# Patient Record
Sex: Female | Born: 2000 | Race: White | Hispanic: Yes | Smoking: Never smoker
Health system: Southern US, Community
[De-identification: ages and names within clinical notes are randomized; demographics above are authoritative.]

## PROBLEM LIST (undated history)

## (undated) DIAGNOSIS — Z789 Other specified health status: Secondary | ICD-10-CM

## (undated) HISTORY — DX: Other specified health status: Z78.9

## (undated) HISTORY — PX: NO PAST SURGERIES: SHX2092

## (undated) HISTORY — PX: OTHER SURGICAL HISTORY: SHX169

---
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
## 2021-09-08 ENCOUNTER — Ambulatory Visit (LOCAL_COMMUNITY_HEALTH_CENTER): Payer: Self-pay

## 2021-09-08 VITALS — BP 107/79 | Ht 62.0 in | Wt 146.5 lb

## 2021-09-08 DIAGNOSIS — Z3201 Encounter for pregnancy test, result positive: Secondary | ICD-10-CM

## 2021-09-08 MED ORDER — PRENATAL 27-0.8 MG PO TABS: 1.0000 | ORAL_TABLET | Freq: Every day | ORAL | 0 refills | Status: AC

## 2021-09-08 NOTE — Progress Notes (Signed)
UPT positive. Plans prenatal care at [facility]. Sent to clerk for preadmit. Jerel Shepherd, RN

## 2021-09-09 LAB — PREGNANCY, URINE: Preg Test, Ur: POSITIVE — AB

## 2021-09-20 ENCOUNTER — Ambulatory Visit: Payer: Medicaid Other | Admitting: Advanced Practice Midwife

## 2021-09-20 ENCOUNTER — Encounter: Payer: Self-pay | Admitting: Advanced Practice Midwife

## 2021-09-20 VITALS — BP 91/52 | HR 88 | Temp 98.6°F | Wt 145.8 lb

## 2021-09-20 DIAGNOSIS — O0930 Supervision of pregnancy with insufficient antenatal care, unspecified trimester: Secondary | ICD-10-CM | POA: Insufficient documentation

## 2021-09-20 DIAGNOSIS — Z3482 Encounter for supervision of other normal pregnancy, second trimester: Secondary | ICD-10-CM

## 2021-09-20 DIAGNOSIS — O0932 Supervision of pregnancy with insufficient antenatal care, second trimester: Secondary | ICD-10-CM | POA: Diagnosis not present

## 2021-09-20 LAB — HEMOGLOBIN, FINGERSTICK: Hemoglobin: 14.1 g/dL (ref 11.1–15.9)

## 2021-09-20 LAB — WET PREP FOR TRICH, YEAST, CLUE
Trichomonas Exam: NEGATIVE
Yeast Exam: NEGATIVE

## 2021-09-20 NOTE — Progress Notes (Signed)
[facility]  Maternal Health Clinic   INITIAL PRENATAL VISIT NOTE  Subjective:  Donna Wolfe is a 21 y.o. DHF nonsmoker G2P1001 (2 yo son) at [redacted]w[redacted]d being seen today to start prenatal care at the [facility]. She feels "very well" about surprise pregnancy with no birth control. 21 yo employed FOB  feels "happy" about pregnancy and has no children; in supportive 6 mo relationship. She is working 40+ hrs/wk and living with FOB, her son, pt's 61 yo sister. Her son's father kicked her out and she was living with her mom until she found this partners. LMP 05/25/21. Denies any u/s or ER visits this pregnancy. Has never had a dental exam. Denies cigs, vaping, cigars, MJ, ETOH. Wants NIPS. Has never had a pap smear.  She is currently monitored for the following issues for this low-risk pregnancy and has Illiterate--needs assistance with reading and writing; Late prenatal care 16 6/7; and Prenatal care, subsequent pregnancy, second trimester on their problem list.  Patient reports no complaints.  Contractions: Not present. Vag. Bleeding: None.  Movement: Absent. Denies leaking of fluid.   Indications for ASA therapy (per uptodate) One of the following: Previous pregnancy with preeclampsia, especially early onset and with an adverse outcome No Multifetal gestation No Chronic hypertension No Type 1 or 2 diabetes mellitus No Chronic kidney disease No Autoimmune disease (antiphospholipid syndrome, systemic lupus erythematosus) No  Two or more of the following: Nulliparity No Obesity (body mass index >30 kg/m2) No Family history of preeclampsia in mother or sister No Age ?35 years No Sociodemographic characteristics (African American race, low socioeconomic level) No Personal risk factors (eg, previous pregnancy with low birth weight or small for gestational age infant, previous adverse pregnancy outcome [eg, stillbirth], interval >10 years between  pregnancies) No   The following portions of the patient's history were reviewed and updated as appropriate: allergies, current medications, past family history, past medical history, past social history, past surgical history and problem list. Problem list updated.  Objective:   Vitals:   09/20/21 0853  BP: (!) 91/52  Pulse: 88  Temp: 98.6 F (37 C)  Weight: 145 lb 12.8 oz (66.1 kg)    Fetal Status: Fetal Heart Rate (bpm): 150 Fundal Height: 18 cm Movement: Absent  Presentation: Undeterminable   Physical Exam Vitals and nursing note reviewed.  Constitutional:      General: She is not in acute distress.    Appearance: Normal appearance. She is well-developed.  HENT:     Head: Normocephalic and atraumatic.     Right Ear: External ear normal.     Left Ear: External ear normal.     Nose: Nose normal. No congestion or rhinorrhea.     Mouth/Throat:     Lips: Pink.     Mouth: Mucous membranes are moist.     Dentition: Normal dentition. No dental caries.     Pharynx: Oropharynx is clear. Uvula midline.     Comments: Dentition: fair; never had dental exam Eyes:     General: No scleral icterus.    Conjunctiva/sclera: Conjunctivae normal.  Neck:     Thyroid: No thyroid mass, thyromegaly or thyroid tenderness.  Cardiovascular:     Rate and Rhythm: Normal rate.     Pulses: Normal pulses.     Comments: Extremities are warm and well perfused Pulmonary:     Effort: Pulmonary effort is normal.     Breath sounds: Normal breath sounds.  Chest:  Chest wall: No mass.  Breasts:    Tanner Score is 5.     Breasts are symmetrical.     Right: Normal. No mass, nipple discharge or skin change.     Left: Normal. No mass, nipple discharge or skin change.  Abdominal:     Palpations: Abdomen is soft.     Tenderness: There is no abdominal tenderness.     Comments: Gravid, soft without masses or tenderness, FH=18 wks, FHR=150  Genitourinary:    General: Normal vulva.     Exam position:  Lithotomy position.     Pubic Area: No rash.      Labia:        Right: No rash.        Left: No rash.      Vagina: Vaginal discharge (white creamy leukorrhea, ph<4.5) present.     Cervix: Normal.     Uterus: Enlarged (Gravid 18 wks size). Not tender.      Rectum: Normal. No external hemorrhoid.  Musculoskeletal:     Right lower leg: No edema.     Left lower leg: No edema.  Lymphadenopathy:     Cervical: No cervical adenopathy.     Upper Body:     Right upper body: No axillary adenopathy.     Left upper body: No axillary adenopathy.  Skin:    General: Skin is warm.     Capillary Refill: Capillary refill takes less than 2 seconds.  Neurological:     Mental Status: She is alert.    Assessment and Plan:  Pregnancy: G2P1001 at [redacted]w[redacted]d  1. Late prenatal care 16 6/7   2. Prenatal care, subsequent pregnancy, second trimester Counseled on weight gain of 15-25 lbs Wants NIPS today Please give pt [facility] # Dating u/s ordered  - Prenatal Profile I - MaterniT21 PLUS Core - AFP, Serum, Open Spina Bifida - 149702 Drug Screen - WET PREP FOR TRICH, YEAST, CLUE - Hemoglobin, fingerstick    Discussed overview of care and coordination with inpatient delivery practices including [facility], [facility], [facility] and [facility].   Reviewed Centering pregnancy as standard of care at [facility]   Preterm labor symptoms and general obstetric precautions including but not limited to vaginal bleeding, contractions, leaking of fluid and fetal movement were reviewed in detail with the patient.  Please refer to After Visit Summary for other counseling recommendations.   Return in about 4 weeks (around 10/18/2021) for routine PNC.  No future appointments.  Alberteen Spindle, CNM

## 2021-09-20 NOTE — Progress Notes (Signed)
Patient here for IP at [redacted]w[redacted]d.   Wet prep and hgb reviewed during clinic visit - no treatment indicated.   [facility] [place] referral faxed with confirmation. 09/20/21.   Earlyne Iba, RN

## 2021-09-21 LAB — 789231 7+OXYCODONE-BUND
Amphetamines, Urine: NEGATIVE ng/mL
BENZODIAZ UR QL: NEGATIVE ng/mL
Barbiturate screen, urine: NEGATIVE ng/mL
Cannabinoid Quant, Ur: NEGATIVE ng/mL
Cocaine (Metab.): NEGATIVE ng/mL
OPIATE SCREEN URINE: NEGATIVE ng/mL
Oxycodone/Oxymorphone, Urine: NEGATIVE ng/mL
PCP Quant, Ur: NEGATIVE ng/mL

## 2021-09-22 LAB — PREGNANCY, INITIAL SCREEN
Antibody Screen: NEGATIVE
Basophils Absolute: 0 10*3/uL (ref 0.0–0.2)
Basos: 0 %
Bilirubin, UA: NEGATIVE
Chlamydia trachomatis, NAA: NEGATIVE
EOS (ABSOLUTE): 0.1 10*3/uL (ref 0.0–0.4)
Eos: 1 %
Glucose, UA: NEGATIVE
HCV Ab: NONREACTIVE
HIV Screen 4th Generation wRfx: NONREACTIVE
Hematocrit: 43.4 % (ref 34.0–46.6)
Hemoglobin: 14 g/dL (ref 11.1–15.9)
Hepatitis B Surface Ag: NEGATIVE
Immature Grans (Abs): 0 10*3/uL (ref 0.0–0.1)
Immature Granulocytes: 0 %
Leukocytes,UA: NEGATIVE
Lymphocytes Absolute: 2.2 10*3/uL (ref 0.7–3.1)
Lymphs: 23 %
MCH: 26.1 pg — ABNORMAL LOW (ref 26.6–33.0)
MCHC: 32.3 g/dL (ref 31.5–35.7)
MCV: 81 fL (ref 79–97)
Monocytes Absolute: 0.6 10*3/uL (ref 0.1–0.9)
Monocytes: 6 %
Neisseria Gonorrhoeae by PCR: NEGATIVE
Neutrophils Absolute: 6.9 10*3/uL (ref 1.4–7.0)
Neutrophils: 70 %
Nitrite, UA: NEGATIVE
Platelets: 346 10*3/uL (ref 150–450)
Protein,UA: NEGATIVE
RBC, UA: NEGATIVE
RBC: 5.36 x10E6/uL — ABNORMAL HIGH (ref 3.77–5.28)
RDW: 17 % — ABNORMAL HIGH (ref 11.7–15.4)
RPR Ser Ql: NONREACTIVE
Rh Factor: POSITIVE
Rubella Antibodies, IGG: 6.55 index (ref >0.99)
Specific Gravity, UA: 1.017 (ref 1.005–1.030)
Urobilinogen, Ur: 1 mg/dL (ref 0.2–1.0)
WBC: 9.8 10*3/uL (ref 3.4–10.8)
pH, UA: 7 (ref 5.0–7.5)

## 2021-09-22 LAB — MICROSCOPIC EXAMINATION
Casts: NONE SEEN /lpf
WBC, UA: NONE SEEN /hpf (ref 0–5)

## 2021-09-22 LAB — URINE CULTURE, OB REFLEX

## 2021-09-22 LAB — HCV INTERPRETATION

## 2021-09-25 LAB — MATERNIT 21 PLUS CORE, BLOOD
Fetal Fraction: 6
Result (T21): NEGATIVE
Trisomy 13 (Patau syndrome): NEGATIVE
Trisomy 18 (Edwards syndrome): NEGATIVE
Trisomy 21 (Down syndrome): NEGATIVE

## 2021-09-25 LAB — AFP, SERUM, OPEN SPINA BIFIDA
AFP MoM: 1
AFP Value: 38 ng/mL
Gest. Age on Collection Date: 16.6 weeks
Maternal Age At EDD: 21.1 yr
OSBR Risk 1 IN: 10000
Test Results:: NEGATIVE
Weight: 145 [lb_av]

## 2021-09-29 ENCOUNTER — Telehealth: Payer: Self-pay

## 2021-09-29 NOTE — Telephone Encounter (Signed)
Patient walked into clinic to switch her delivering provider and ultrasound to [facility] due to her having transportation issues to go to [facility].   Patient made aware that in her next [facility] RV we will update consent.   However, RN called [facility] scheduling and [facility] clinic to see if they had any cancellations and upcoming appointments. Per Pamala Hurry (scheduling) and [facility] (clinic) the earliest appt will be for end of October.   Patient has [facility] [place] for 10/07/21 and I encouraged patient to find a way to make it to this appt as her 2 month medicaid will cover this visit and not an ultrasound in October. Also make aware that [facility] [place] will be earliest in 5-6 weeks.   Patient states she will make it to [facility] [place] on 10/07/21 and call us if she can not make it so we can scheduled a [facility] [place].   Al Decant

## 2021-10-17 ENCOUNTER — Telehealth: Payer: Self-pay

## 2021-10-17 NOTE — Telephone Encounter (Signed)
TC to patient to reschedule 10/18/21 MH RV due to staffing issues in the morning. Patient rescheduled to 10/26/21. Interpreter V. Olmedo.Jenetta Downer, RN

## 2021-10-18 ENCOUNTER — Ambulatory Visit: Payer: Medicaid Other

## 2021-10-26 ENCOUNTER — Ambulatory Visit: Payer: Medicaid Other

## 2021-10-26 ENCOUNTER — Telehealth: Payer: Self-pay

## 2021-10-26 NOTE — Telephone Encounter (Signed)
[facility] for [place] RV 10/26/2021. Call to client with [facility]pre[employer] ID # [phone] and left message requesting she reschedule appt. Number to call provided. Rich Number, RN

## 2021-10-28 NOTE — Telephone Encounter (Signed)
Call to patient to schedule missed maternity visit on 10/26/21.   Scheduled patient on first Wednesday available (as she can only do Wednesday, Thursday, or Fridays).   Appt scheduled for November 09, 2021 for 1:40 arrival time. Patient verbalized understanding.   Al Decant, RN

## 2021-11-09 ENCOUNTER — Ambulatory Visit: Payer: Self-pay

## 2021-11-09 ENCOUNTER — Telehealth: Payer: Self-pay

## 2021-11-09 NOTE — Telephone Encounter (Signed)
TC to patient to reschedule missed MH RV on 11/09/21. Patient phone not taking calls. TC to patient emergency contact, her mother, mother was asked to have patient call us and phone number provided. Patient mother states she is at work and can't hear very well due to the noise at work. Mother asked to have patient call [facility], she agreed. [facility] interpreters, [phone].Marland KitchenJenetta Downer, RN

## 2021-11-10 NOTE — Telephone Encounter (Signed)
Attempt to call patient regarding missed MH RV on 11/09/21. Phone number is not in service.   Called her emergency contact, her mother Elie Goody and ask her for an updated phone number for patient.   Please update phone number on file.    Al Decant, RN

## 2021-11-15 NOTE — Telephone Encounter (Signed)
Per [facility] appt desk, client has scheduled [facility] RV appt 11/23/2021. Rich Number, RN

## 2021-11-23 ENCOUNTER — Ambulatory Visit: Payer: Self-pay | Admitting: Advanced Practice Midwife

## 2021-11-23 VITALS — BP 95/59 | HR 89 | Temp 97.8°F | Wt 154.0 lb

## 2021-11-23 DIAGNOSIS — O0930 Supervision of pregnancy with insufficient antenatal care, unspecified trimester: Secondary | ICD-10-CM

## 2021-11-23 DIAGNOSIS — O0932 Supervision of pregnancy with insufficient antenatal care, second trimester: Secondary | ICD-10-CM

## 2021-11-23 DIAGNOSIS — Z550 Illiteracy and low-level literacy: Secondary | ICD-10-CM | POA: Insufficient documentation

## 2021-11-23 DIAGNOSIS — Z91199 Patient's noncompliance with other medical treatment and regimen due to unspecified reason: Secondary | ICD-10-CM | POA: Insufficient documentation

## 2021-11-23 DIAGNOSIS — Z3482 Encounter for supervision of other normal pregnancy, second trimester: Secondary | ICD-10-CM

## 2021-11-23 DIAGNOSIS — O09892 Supervision of other high risk pregnancies, second trimester: Secondary | ICD-10-CM

## 2021-11-23 NOTE — Progress Notes (Signed)
Patient here for MH RV at [redacted]w[redacted]d.   Patient decided to switch delivering practice from [facility]  to [facility], Consent updated today and gave patient phone number for [facility] labor and delivery triage.   Phone number and emergency contact verified with patient, states it is correct.   PTL handout discussed today.   Earlyne Iba, RN

## 2021-11-23 NOTE — Progress Notes (Signed)
[facility] Maternal Health Clinic  PRENATAL VISIT NOTE  Subjective:  Donna Wolfe is a 21 y.o. G2P1001 at [redacted]w[redacted]d being seen today for ongoing prenatal care.  She is currently monitored for the following issues for this low-risk pregnancy and has Late prenatal care 16 6/7; Prenatal care, subsequent pregnancy, second trimester; Noncompliant pregnant patient with no care x 9 wks; and Illiterate on their problem list.  Patient reports no complaints.  Contractions: Not present. Vag. Bleeding: None.  Movement: Present. Denies leaking of fluid/ROM.   The following portions of the patient's history were reviewed and updated as appropriate: allergies, current medications, past family history, past medical history, past social history, past surgical history and problem list. Problem list updated.  Objective:   Vitals:   11/23/21 0817  BP: (!) 95/59  Pulse: 89  Temp: 97.8 F (36.6 C)  Weight: 154 lb (69.9 kg)    Fetal Status: Fetal Heart Rate (bpm): 140 Fundal Height: 27 cm Movement: Present     General:  Alert, oriented and cooperative. Patient is in no acute distress.  Skin: Skin is warm and dry. No rash noted.   Cardiovascular: Normal heart rate noted  Respiratory: Normal respiratory effort, no problems with respiration noted  Abdomen: Soft, gravid, appropriate for gestational age.  Pain/Pressure: Absent     Pelvic: Cervical exam deferred        Extremities: Normal range of motion.  Edema: None  Mental Status: Normal mood and affect. Normal behavior. Normal judgment and thought content.   Assessment and Plan:  Pregnancy: G2P1001 at [redacted]w[redacted]d  1. Prenatal care, subsequent pregnancy, second trimester 4 lb (1.814 kg) Hasn't made dental apt yet--# given to [facility] again and urged to make apt Reviewed 10/07/21 u/s at 20.0 wks with anterior placenta, AFI wnl Not working 9 lb wt gain in past 9 wks Not exercising C/o back ache--suggestions  given NIPS 09/20/21 neg AFP only neg 09/20/21  2. Late prenatal care 16 6/7   3. Noncompliant pregnant patient with no care x 9 wks   4. Illiterate    Preterm labor symptoms and general obstetric precautions including but not limited to vaginal bleeding, contractions, leaking of fluid and fetal movement were reviewed in detail with the patient. Please refer to After Visit Summary for other counseling recommendations.  Return in about 2 weeks (around 12/07/2021) for routine PNC, 28 week labs.  No future appointments.  Alberteen Spindle, CNM

## 2021-12-07 ENCOUNTER — Ambulatory Visit: Payer: Self-pay | Admitting: Advanced Practice Midwife

## 2021-12-07 VITALS — BP 105/67 | HR 105 | Temp 97.9°F | Wt 157.6 lb

## 2021-12-07 DIAGNOSIS — Z3483 Encounter for supervision of other normal pregnancy, third trimester: Secondary | ICD-10-CM

## 2021-12-07 DIAGNOSIS — O0933 Supervision of pregnancy with insufficient antenatal care, third trimester: Secondary | ICD-10-CM

## 2021-12-07 DIAGNOSIS — O09892 Supervision of other high risk pregnancies, second trimester: Secondary | ICD-10-CM

## 2021-12-07 DIAGNOSIS — Z23 Encounter for immunization: Secondary | ICD-10-CM

## 2021-12-07 DIAGNOSIS — O0930 Supervision of pregnancy with insufficient antenatal care, unspecified trimester: Secondary | ICD-10-CM

## 2021-12-07 DIAGNOSIS — Z3482 Encounter for supervision of other normal pregnancy, second trimester: Secondary | ICD-10-CM

## 2021-12-07 DIAGNOSIS — O09893 Supervision of other high risk pregnancies, third trimester: Secondary | ICD-10-CM

## 2021-12-07 DIAGNOSIS — Z91199 Patient's noncompliance with other medical treatment and regimen due to unspecified reason: Secondary | ICD-10-CM

## 2021-12-07 LAB — WET PREP FOR TRICH, YEAST, CLUE
Trichomonas Exam: NEGATIVE
Yeast Exam: NEGATIVE

## 2021-12-07 LAB — HEMOGLOBIN, FINGERSTICK: Hemoglobin: 12 g/dL (ref 11.1–15.9)

## 2021-12-07 NOTE — Progress Notes (Signed)
Wet mount and hgb reviewed during clinic visit - no treatment indicated.   Earlyne Iba, RN

## 2021-12-07 NOTE — Progress Notes (Signed)
[facility] Maternal Health Clinic  PRENATAL VISIT NOTE  Subjective:  Donna Wolfe is a 21 y.o. G2P1001 at [redacted]w[redacted]d being seen today for ongoing prenatal care.  She is currently monitored for the following issues for this low-risk pregnancy and has Late prenatal care 16 6/7; Prenatal care, subsequent pregnancy, second trimester; Noncompliant pregnant patient with no care x 9 wks; and Illiterate on their problem list.  Patient reports  painful sex and dysuria x 3 wks .  Contractions: Not present. Vag. Bleeding: None.  Movement: Present. Denies leaking of fluid/ROM.   The following portions of the patient's history were reviewed and updated as appropriate: allergies, current medications, past family history, past medical history, past social history, past surgical history and problem list. Problem list updated.  Objective:   Vitals:   12/07/21 0843  BP: 105/67  Pulse: (!) 105  Temp: 97.9 F (36.6 C)  Weight: 157 lb 9.6 oz (71.5 kg)    Fetal Status: Fetal Heart Rate (bpm): 155 Fundal Height: 30 cm Movement: Present     General:  Alert, oriented and cooperative. Patient is in no acute distress.  Skin: Skin is warm and dry. No rash noted.   Cardiovascular: Normal heart rate noted  Respiratory: Normal respiratory effort, no problems with respiration noted  Abdomen: Soft, gravid, appropriate for gestational age.  Pain/Pressure: Absent     Pelvic: Cervical exam deferred        Extremities: Normal range of motion.  Edema: None  Mental Status: Normal mood and affect. Normal behavior. Normal judgment and thought content.   Assessment and Plan:  Pregnancy: G2P1001 at [redacted]w[redacted]d  1. Prenatal care, subsequent pregnancy, second trimester C/o painful sex and dysuria x 3 wks; drinks 1 c. Coffee qo day and coke 3x/wk--to d/c Wet mount and GC/Chlamydia done; vagina pink with white creamy leukorrhea, ph<4.5 Not working Not exercising because is too tired Living with FOB,  their 2 1/2 yo son, her 4 yo sister 1 hour glucola today Made dental apt for 01/05/22 at [facility]  - Hemoglobin, fingerstick - Glucose, 1 hour gestational - HIV-1/HIV-2 Qualitative RNA - RPR - Tdap vaccine greater than or equal to 7yo IM - WET PREP FOR TRICH, YEAST, CLUE - Urine Culture & Sensitivity - Chlamydia/GC NAA, Confirmation  2. Late prenatal care 16 6/7   3. Noncompliant pregnant patient with no care x 9 wks    Preterm labor symptoms and general obstetric precautions including but not limited to vaginal bleeding, contractions, leaking of fluid and fetal movement were reviewed in detail with the patient. Please refer to After Visit Summary for other counseling recommendations.  Return in about 2 weeks (around 12/21/2021) for routine PNC.  No future appointments.  Alberteen Spindle, CNM

## 2021-12-09 LAB — GLUCOSE, 1 HOUR GESTATIONAL: Gestational Diabetes Screen: 116 mg/dL (ref 70–139)

## 2021-12-09 LAB — HIV-1/HIV-2 QUALITATIVE RNA
HIV-1 RNA, Qualitative: NONREACTIVE
HIV-2 RNA, Qualitative: NONREACTIVE

## 2021-12-09 LAB — RPR: RPR Ser Ql: NONREACTIVE

## 2021-12-10 LAB — CHLAMYDIA/GC NAA, CONFIRMATION
Chlamydia trachomatis, NAA: NEGATIVE
Neisseria gonorrhoeae, NAA: NEGATIVE

## 2021-12-10 LAB — URINE CULTURE

## 2021-12-22 ENCOUNTER — Ambulatory Visit: Payer: Self-pay

## 2021-12-22 ENCOUNTER — Telehealth: Payer: Self-pay

## 2021-12-22 NOTE — Telephone Encounter (Signed)
TC to patient regarding missed MH RV appointment today, and mother answered. Phone number in patient chart belongs to mother and she states patient does not live with her. Per mother, patient plans to pay her phone bill in a day or two. Mother was asked to have client call [facility], number to call given. Mother states she will let client know to call for appointment. Interpreter, M. Yemen.Burt Knack, RN

## 2021-12-27 NOTE — Telephone Encounter (Signed)
Call to client at number of record (same as mother who is emergency contact). Per mother, client's phone is currently cut off. Mother states she gave her last message to call and schedule appt and states she will give her the message again. Roddie Mc Yemen interpreted during call. Jossie Ng, RN

## 2021-12-29 ENCOUNTER — Encounter: Payer: Self-pay | Admitting: Obstetrics and Gynecology

## 2021-12-29 ENCOUNTER — Inpatient Hospital Stay
Admission: EM | Admit: 2021-12-29 | Discharge: 2021-12-31 | DRG: 833 | Disposition: A | Discharge status: Home / Self Care | Payer: Medicaid Other | Attending: Certified Nurse Midwife | Admitting: Certified Nurse Midwife

## 2021-12-29 ENCOUNTER — Other Ambulatory Visit: Payer: Self-pay

## 2021-12-29 DIAGNOSIS — Z91199 Patient's noncompliance with other medical treatment and regimen due to unspecified reason: Secondary | ICD-10-CM

## 2021-12-29 DIAGNOSIS — Z3A31 31 weeks gestation of pregnancy: Secondary | ICD-10-CM

## 2021-12-29 LAB — GROUP B STREP BY PCR: Group B strep by PCR: NEGATIVE

## 2021-12-29 LAB — URINALYSIS, COMPLETE (UACMP) WITH MICROSCOPIC
Bilirubin Urine: NEGATIVE
Glucose, UA: NEGATIVE mg/dL
Hgb urine dipstick: NEGATIVE
Ketones, ur: NEGATIVE mg/dL
Leukocytes,Ua: NEGATIVE
Nitrite: NEGATIVE
Protein, ur: NEGATIVE mg/dL
Specific Gravity, Urine: 1.004 — ABNORMAL LOW (ref 1.005–1.030)
pH: 7 (ref 5.0–8.0)

## 2021-12-29 LAB — URINE DRUG SCREEN, QUALITATIVE (ARMC ONLY)
Amphetamines, Ur Screen: NOT DETECTED
Barbiturates, Ur Screen: NOT DETECTED
Benzodiazepine, Ur Scrn: NOT DETECTED
Cannabinoid 50 Ng, Ur ~~LOC~~: NOT DETECTED
Cocaine Metabolite,Ur ~~LOC~~: NOT DETECTED
MDMA (Ecstasy)Ur Screen: NOT DETECTED
Methadone Scn, Ur: NOT DETECTED
Opiate, Ur Screen: NOT DETECTED
Phencyclidine (PCP) Ur S: NOT DETECTED
Tricyclic, Ur Screen: NOT DETECTED

## 2021-12-29 LAB — FETAL FIBRONECTIN: Fetal Fibronectin: POSITIVE — AB

## 2021-12-29 LAB — WET PREP, GENITAL
Clue Cells Wet Prep HPF POC: NONE SEEN
Sperm: NONE SEEN
Trich, Wet Prep: NONE SEEN
WBC, Wet Prep HPF POC: >=10 — AB (ref <10)
Yeast Wet Prep HPF POC: NONE SEEN

## 2021-12-29 LAB — CBC
HCT: 34.3 % — ABNORMAL LOW (ref 36.0–46.0)
Hemoglobin: 11.3 g/dL — ABNORMAL LOW (ref 12.0–15.0)
MCH: 25.7 pg — ABNORMAL LOW (ref 26.0–34.0)
MCHC: 32.9 g/dL (ref 30.0–36.0)
MCV: 78.1 fL — ABNORMAL LOW (ref 80.0–100.0)
Platelets: 336 10*3/uL (ref 150–400)
RBC: 4.39 MIL/uL (ref 3.87–5.11)
RDW: 14.5 % (ref 11.5–15.5)
WBC: 11.8 10*3/uL — ABNORMAL HIGH (ref 4.0–10.5)
nRBC: 0 % (ref 0.0–0.2)

## 2021-12-29 LAB — COMPREHENSIVE METABOLIC PANEL
ALT: 8 U/L (ref 0–44)
AST: 23 U/L (ref 15–41)
Albumin: 2.7 g/dL — ABNORMAL LOW (ref 3.5–5.0)
Alkaline Phosphatase: 133 U/L — ABNORMAL HIGH (ref 38–126)
Anion gap: 9 (ref 5–15)
BUN: 6 mg/dL (ref 6–20)
CO2: 21 mmol/L — ABNORMAL LOW (ref 22–32)
Calcium: 8.3 mg/dL — ABNORMAL LOW (ref 8.9–10.3)
Chloride: 106 mmol/L (ref 98–111)
Creatinine, Ser: 0.45 mg/dL (ref 0.44–1.00)
GFR, Estimated: >60 mL/min (ref >60)
Glucose, Bld: 139 mg/dL — ABNORMAL HIGH (ref 70–99)
Potassium: 3.4 mmol/L — ABNORMAL LOW (ref 3.5–5.1)
Sodium: 136 mmol/L (ref 135–145)
Total Bilirubin: 0.7 mg/dL (ref 0.3–1.2)
Total Protein: 6.5 g/dL (ref 6.5–8.1)

## 2021-12-29 LAB — CHLAMYDIA/NGC RT PCR (ARMC ONLY)
Chlamydia Tr: NOT DETECTED
N gonorrhoeae: NOT DETECTED

## 2021-12-29 LAB — TYPE AND SCREEN
ABO/RH(D): O POS
Antibody Screen: NEGATIVE

## 2021-12-29 MED ORDER — LACTATED RINGERS IV SOLN: 500.0000 mL | INTRAVENOUS | Status: DC | PRN

## 2021-12-29 MED ORDER — OXYTOCIN-SODIUM CHLORIDE 30-0.9 UT/500ML-% IV SOLN
2.5000 [IU]/h | INTRAVENOUS | Status: DC
  Filled 2021-12-29: qty 500

## 2021-12-29 MED ORDER — MISOPROSTOL 200 MCG PO TABS
ORAL_TABLET | ORAL | Status: AC
  Filled 2021-12-29: qty 4

## 2021-12-29 MED ORDER — LIDOCAINE HCL (PF) 1 % IJ SOLN
INTRAMUSCULAR | Status: AC
  Filled 2021-12-29: qty 30

## 2021-12-29 MED ORDER — MAGNESIUM SULFATE BOLUS VIA INFUSION
4.0000 g | Freq: Once | INTRAVENOUS | Status: AC
  Administered 2021-12-29: 4 g via INTRAVENOUS
  Filled 2021-12-29: qty 1000

## 2021-12-29 MED ORDER — BETAMETHASONE SOD PHOS & ACET 6 (3-3) MG/ML IJ SUSP
INTRAMUSCULAR | Status: AC
  Administered 2021-12-29: 12 mg via INTRAMUSCULAR
  Filled 2021-12-29: qty 5

## 2021-12-29 MED ORDER — MAGNESIUM SULFATE 40 GM/1000ML IV SOLN
1.0000 g/h | INTRAVENOUS | Status: DC
  Administered 2021-12-30: 1 g/h via INTRAVENOUS
  Filled 2021-12-29 (×2): qty 1000

## 2021-12-29 MED ORDER — LACTATED RINGERS IV SOLN: INTRAVENOUS | Status: DC

## 2021-12-29 MED ORDER — TERBUTALINE SULFATE 1 MG/ML IJ SOLN: 0.2500 mg | Freq: Once | INTRAMUSCULAR | Status: AC

## 2021-12-29 MED ORDER — NIFEDIPINE 10 MG PO CAPS
20.0000 mg | ORAL_CAPSULE | Freq: Once | ORAL | Status: AC
  Administered 2021-12-29: 20 mg via ORAL
  Filled 2021-12-29: qty 2

## 2021-12-29 MED ORDER — TERBUTALINE SULFATE 1 MG/ML IJ SOLN
INTRAMUSCULAR | Status: AC
  Administered 2021-12-29: 0.25 mg via SUBCUTANEOUS
  Filled 2021-12-29: qty 1

## 2021-12-29 MED ORDER — LIDOCAINE HCL (PF) 1 % IJ SOLN: 30.0000 mL | INTRAMUSCULAR | Status: DC | PRN

## 2021-12-29 MED ORDER — AMMONIA AROMATIC IN INHA
RESPIRATORY_TRACT | Status: AC
  Filled 2021-12-29: qty 10

## 2021-12-29 MED ORDER — OXYTOCIN 10 UNIT/ML IJ SOLN
INTRAMUSCULAR | Status: AC
  Filled 2021-12-29: qty 2

## 2021-12-29 MED ORDER — FENTANYL CITRATE (PF) 100 MCG/2ML IJ SOLN
50.0000 ug | INTRAMUSCULAR | Status: DC | PRN
  Administered 2021-12-29 – 2021-12-30 (×3): 100 ug via INTRAVENOUS
  Filled 2021-12-29 (×3): qty 2

## 2021-12-29 MED ORDER — SOD CITRATE-CITRIC ACID 500-334 MG/5ML PO SOLN: 30.0000 mL | ORAL | Status: DC | PRN

## 2021-12-29 MED ORDER — NIFEDIPINE 10 MG PO CAPS
10.0000 mg | ORAL_CAPSULE | Freq: Four times a day (QID) | ORAL | Status: DC
  Administered 2021-12-30 – 2021-12-31 (×4): 10 mg via ORAL
  Filled 2021-12-29 (×3): qty 1

## 2021-12-29 MED ORDER — OXYTOCIN BOLUS FROM INFUSION: 333.0000 mL | Freq: Once | INTRAVENOUS | Status: DC

## 2021-12-29 MED ORDER — BETAMETHASONE SOD PHOS & ACET 6 (3-3) MG/ML IJ SUSP
12.0000 mg | INTRAMUSCULAR | Status: AC
  Administered 2021-12-30: 12 mg via INTRAMUSCULAR
  Filled 2021-12-29: qty 5

## 2021-12-29 MED ORDER — CALCIUM GLUCONATE 10 % IV SOLN
INTRAVENOUS | Status: AC
  Filled 2021-12-29: qty 10

## 2021-12-29 MED ORDER — LACTATED RINGERS IV BOLUS: 500.0000 mL | Freq: Once | INTRAVENOUS | Status: DC

## 2021-12-29 MED ORDER — ACETAMINOPHEN 325 MG PO TABS
650.0000 mg | ORAL_TABLET | ORAL | Status: DC | PRN
  Administered 2021-12-30 (×2): 650 mg via ORAL
  Filled 2021-12-29: qty 2

## 2021-12-29 MED ORDER — ONDANSETRON HCL 4 MG/2ML IJ SOLN: 4.0000 mg | Freq: Four times a day (QID) | INTRAMUSCULAR | Status: DC | PRN

## 2021-12-29 NOTE — Consult Note (Signed)
Consultation Service: Neonatology   Hassan Buckler, CNM requested consultation on Donna Wolfe regarding the care of a premature infant at 76 1/7 weeks. Thank you for inviting [place] to see this patient.   Reason for consult:  Explain the possible complications, the prognosis, and the care of a premature infant at 93 and 1/7 weeks.  Chief complaint: 21 y.o. female with a female IUP.  Pregnancy has been complicated by  preterm labor and limited PNC .  Plan is to start Magnesium sulfate for neuroprotection, administer BMZ and procardia, and start IV fluids.  Key findings of this patient's HPI are:  I have reviewed the patient's chart and have met with her. The salient information is as follows:   Mom is admitted to L&D for preterm labor and is dilated to 3 cm with intact membranes. Monitoring for fetal/maternal distress with no concerns at this time.   Prenatal labs:  ABO Rh: O+ RPR: NR HBV: Negative GBS: Unknown - pending Rubella: Immune HIV: Negative GC: Negative Chlamydia: Negative   Prenatal care:   limited Pregnancy complications:  preterm labor Maternal antibiotics: None Maternal Steroids: First dose 12/29/21 Most recent dose:  12/29/21 at 1749   Medications:   ammonia       betamethasone acetate-betamethasone sodium phosphate  12 mg Intramuscular Q24 Hr x 2   calcium gluconate       lidocaine (PF)       misoprostol       [START ON 12/30/2021] NIFEdipine  10 mg Oral Q6H   oxytocin       oxytocin 40 units in LR 1000 mL  333 mL Intravenous Once    Family History  Problem Relation Age of Onset   Healthy Mother    Healthy Father    Healthy Sister    Healthy Sister    Healthy Brother    Healthy Brother    Healthy Son    Diabetes Maternal Grandmother    Healthy Maternal Grandfather    Healthy Paternal Grandmother    Healthy Paternal Grandfather    Social History   Socioeconomic History   Marital status: Soil scientist    Spouse name: Drema Balzarine    Number of children: 1   Years of education: 10   Highest education level: 10th grade  Occupational History   Occupation: unemployed  Tobacco Use   Smoking status: Never    Passive exposure: Never   Smokeless tobacco: Never  Vaping Use   Vaping Use: Never used  Substance and Sexual Activity   Alcohol use: Never   Drug use: Never   Sexual activity: Yes    Birth control/protection: None  Other Topics Concern   Not on file  Social History Narrative   Lives with her sister, son, and partner/significant other. Partner, Reynaldo Minium, is supportive and happy with pregnancy, it will be his first child.    Social Determinants of Health   Financial Resource Strain: Low Risk  (09/20/2021)   Overall Financial Resource Strain (CARDIA)    Difficulty of Paying Living Expenses: Not hard at all  Food Insecurity: No Food Insecurity (09/20/2021)   Hunger Vital Sign    Worried About Running Out of Food in the Last Year: Never true    Ran Out of Food in the Last Year: Never true  Transportation Needs: No Transportation Needs (09/20/2021)   PRAPARE - Hydrologist (Medical): No    Lack of Transportation (Non-Medical): No  Physical Activity:  Not on file  Stress: Not on file  Social Connections: Not on file     My recommendations for this patient and my actions included:   1. In the presence of [employer] F[employer], interpreter services, and Donnetta Simpers, RN I spent 20 minutes discussing the possible complications and outcomes of prematurity at this gestational age. I discussed specific complications at this gestational age referencing the need for resuscitation at birth due to respiratory distress which may require mechanical ventilation, CPAP, and surfactant administration. In addition infant may require IV fluids pending establishment of enteral feeds (encouraged breast milk feeding), antibiotics for possible sepsis, temperature support, and continuous monitoring. I also  discussed the potential risk of complications such as RDS, apnea of prematurity, and hyperbilirubinemia. I discussed this with parents in detail and they expressed an understanding of the risks and complications of prematurity.   2. I informed her that the NICU team would be present at the delivery.  She also understood that our team will always be available for any questions that come up during their infant's hospitalization and we will continue to partner with their family to support them through this difficult time. Visitation policy was discussed and all questions were addressed.   Final Impression:  21 y.o. female with a female at 41 1/7 weeks IUP who is threatening to deliver and who now understands the possible complications and prognosis of her infant. Donna Wolfe's questions were answered. She is planning to try and provide breast milk for her infant and formula feed.    ______________________________________________________________________  Thank you for asking [place] to participate in the care of this patient. Please do not hesitate to contact us again if you are aware of any further ways we can be of assistance.   Sincerely,  Mang Hazelrigg, NNP-BC  I spent ~30 minutes in consultation time, of which 20 minutes was spent in direct face to face counseling.   Supervising physician: Clarnce Flock.

## 2021-12-29 NOTE — Telephone Encounter (Signed)
Return call to client and left message to call and reschedule [facility] RV appt. Number to call provided. Jossie Ng, RN

## 2021-12-29 NOTE — Telephone Encounter (Signed)
[employer] ID # [id number] assisted with call. Jossie Ng, RN

## 2021-12-29 NOTE — Progress Notes (Signed)
Pt presents to L/D triage with reported contractions that began last night and have increased in frequency and intensity since today at 1500. She rates the pain 10/10. Pt reports no urinary or vaginal discomfort. Pt reports positive fetal movement and no bleeding or LOF. Monitors applied and assessing. VSS. Pt reports no recent intercourse. No known issues this pregnancy.  CNM at bedside to assess pt.

## 2021-12-29 NOTE — Progress Notes (Signed)
Labor Progress Note  Donna Wolfe is a 21 y.o. G2P1001 at [redacted]w[redacted]d by LMP admitted for Preterm labor  Subjective: feeling more pain.   Objective: BP 115/67 (BP Location: Left Arm)   Pulse (!) 109   Temp 98 F (36.7 C) (Oral)   Resp 19   Ht 5\' 2"  (1.575 m)   Wt 73.5 kg   LMP 05/25/2021 (Exact Date)   SpO2 97%   BMI 29.63 kg/m  Notable VS details: reviewed  Fetal Assessment: FHT:  FHR: 135 bpm, variability: moderate,  accelerations:  Present,  decelerations:  Absent Category/reactivity:  Category I UC:   regular, every 3-4 minutes SVE:   3/70/-1, soft/posterior; scant amount bloody show Membrane status: intact Amniotic color: n/a  Labs: Lab Results  Component Value Date   WBC 11.8 (H) 12/29/2021   HGB 11.3 (L) 12/29/2021   HCT 34.3 (L) 12/29/2021   MCV 78.1 (L) 12/29/2021   PLT 336 12/29/2021    Assessment / Plan: G2P1001 at [redacted]w[redacted]d  Preterm labor  Labor: some cervical change since admit, Pt feeling more painful UCs but declines IVPM.  - s/p BMZ x 1, terb x 1 dose - Mag infusing at 2gm/hr for neuro protection.  - Procardia 20mg  IR given loadig dose PO.  - neonatology in room for consult.   Preeclampsia:  no e/o preE Fetal Wellbeing:  Category I Pain Control:  Labor support without medications I/D:   GBS neg Anticipated MOD:  NSVD  [redacted]w[redacted]d, CNM 12/29/2021, 8:26 PM

## 2021-12-29 NOTE — Telephone Encounter (Signed)
Call to client with Donna Wolfe. Client is using mother's phone number as her contact number while her phone is turned off. Mother requested return call in 10 minutes as she will take phone to [place]. Jossie Ng, RN

## 2021-12-29 NOTE — H&P (Signed)
OB History & Physical   History of Present Illness:  Chief Complaint: painful UCs  HPI:  Donna Wolfe is a 21 y.o. G93P1001 female at [redacted]w[redacted]d dated by [place] at [redacted]w[redacted]d; EDD 03/01/22.  She presents to L&D for painful contractions that started around 3pm, denies LOF or VB. Reports walking makes the UCs hurt more. Pt reports no recent IC, denies vaginal itching or burning.     Pregnancy Issues: 1. Noncompliant with prenatal care, s/p 3 visits at [facility] 2. Language barrier, spanish speaking only, also illiterate.    Maternal Medical History:   Past Medical History:  Diagnosis Date   Medical history non-contributory    Patient denies medical problems    Preterm labor 12/29/2021    Past Surgical History:  Procedure Laterality Date   denies      No Known Allergies  Prior to Admission medications   Medication Sig Start Date End Date Taking? Authorizing Provider  Prenatal Vit-Fe Fumarate-FA (PRENATAL MULTIVITAMIN) TABS tablet Take 1 tablet by mouth daily at 12 noon.   Yes [provider]     Prenatal care site: [facility]   Social History: She  reports that she has never smoked. She has never been exposed to tobacco smoke. She has never used smokeless tobacco. She reports that she does not drink alcohol and does not use drugs.  Family History: family history includes Diabetes in her maternal grandmother; Healthy in her brother, brother, father, maternal grandfather, mother, paternal grandfather, paternal grandmother, sister, sister, and son.   Review of Systems: A full review of systems was performed and negative except as noted in the HPI.     Physical Exam:  Vital Signs: Ht 5\' 2"  (1.575 m)   Wt 73.5 kg   LMP 05/25/2021 (Exact Date)   BMI 29.63 kg/m   General: no acute distress.  HEENT: normocephalic, atraumatic Heart: regular rate & rhythm.  No murmurs/rubs/gallops Lungs: clear to auscultation bilaterally, normal respiratory effort Abdomen:  soft, gravid, non-tender;  EFW: 4lbs Pelvic:   External: Normal external female genitalia  Cervix: Dilation: 2 / Effacement (%): 50 / Station: -2    Extremities: non-tender, symmetric, no edema bilaterally.  DTRs: 2+  Neurologic: Alert & oriented x 3.    No results found for this or any previous visit (from the past 24 hour(s)).  Pertinent Results:  Prenatal Labs: Blood type/Rh O Pos  Antibody screen neg  Rubella Immune  Varicella  Hx Varivax x 2  RPR NR  HBsAg Neg  HIV NR  GC neg  Chlamydia neg  Genetic screening Negative materniT21 and AFP  1 hour GTT  116  3 hour GTT   GBS  Pending via PCR   FHT: 135bpm, mod var, + accels, no decels TOCO: q60min SVE:  Dilation: 2 / Effacement (%): 50 / Station: -2    Cephalic by leopolds/SVE  No results found.  Assessment:  Donna Wolfe is a 21 y.o. G82P1001 female at [redacted]w[redacted]d with preterm labor.   Plan:  1. Admit to Labor & Delivery; consents reviewed and obtained - Dr [redacted]w[redacted]d notified of pt status and POC.  - Preterm labor: terb given, Mag sulfate for neuroprotection, Neonatology consult, BMZ. IV Fluids.    2. Fetal Well being  - Fetal Tracing: Cat I  - Group B Streptococcus ppx indicated: Pending via PCR - Presentation: cephalic confirmed by bedside Feliberto Gottron   3. Routine OB: - Prenatal labs reviewed, as above - Rh O pos - CBC,  T&S, RPR on admit - Clear fluids, IVF  4. Monitoring of Labor -  Contractions: external toco in place -  Pelvis proven to 6lbs -  Plan for continuous fetal monitoring and Toco.    5. Post Partum Planning: - Infant feeding: breast and formula - Contraception: undecided  Randa Ngo, CNM 12/29/21 5:47 PM

## 2021-12-29 NOTE — Progress Notes (Signed)
Called lab in regards to a fetal fibronectin that was collected on patient Donna Wolfe at 0488 but had not been resulted as of 1955. Lab verified that they had received the specimen and will result ASAP.  Exie Parody RN

## 2021-12-29 NOTE — Progress Notes (Signed)
Labor Progress Note  Donna Wolfe is a 21 y.o. G2P1001 at [redacted]w[redacted]d by LMP admitted for Preterm labor  Subjective: feeling more pain and pressure, requesting pain meds.   Objective: BP 98/61 (BP Location: Left Arm)   Pulse (!) 106   Temp 98.6 F (37 C) (Oral)   Resp 20   Ht 5\' 2"  (1.575 m)   Wt 73.5 kg   LMP 05/25/2021 (Exact Date)   SpO2 96%   BMI 29.63 kg/m  Notable VS details: reviewed  Fetal Assessment: FHT:  FHR: 135 bpm, variability: moderate,  accelerations:  Present,  decelerations:  Absent Category/reactivity:  Category I UC:   regular, every 2-3 minutes SVE:   3/70/-2, soft/posterior; scant amount bloody show Membrane status: intact Amniotic color: n/a  Labs: Lab Results  Component Value Date   WBC 11.8 (H) 12/29/2021   HGB 11.3 (L) 12/29/2021   HCT 34.3 (L) 12/29/2021   MCV 78.1 (L) 12/29/2021   PLT 336 12/29/2021    Assessment / Plan: G2P1001 at [redacted]w[redacted]d  Preterm labor  Labor: some cervical change since admit, Pt feeling more painful UCs and now requesting pain meds.  - s/p BMZ x 1, terb x 1 dose - Mag infusing at 2gm/hr for neuro protection.  - Procardia 20mg  IR given loadig dose PO.  - neonatology consult.   Preeclampsia:  no e/o preE Fetal Wellbeing:  Category I Pain Control:  IV pain meds I/D:   GBS neg Anticipated MOD:  NSVD  [redacted]w[redacted]d Clarita Mcelvain, CNM 12/29/2021, 9:08 PM

## 2021-12-30 LAB — COMPREHENSIVE METABOLIC PANEL
ALT: 9 U/L (ref 0–44)
AST: 23 U/L (ref 15–41)
Albumin: 2.9 g/dL — ABNORMAL LOW (ref 3.5–5.0)
Alkaline Phosphatase: 136 U/L — ABNORMAL HIGH (ref 38–126)
Anion gap: 8 (ref 5–15)
BUN: <5 mg/dL — ABNORMAL LOW (ref 6–20)
CO2: 20 mmol/L — ABNORMAL LOW (ref 22–32)
Calcium: 7.5 mg/dL — ABNORMAL LOW (ref 8.9–10.3)
Chloride: 109 mmol/L (ref 98–111)
Creatinine, Ser: 0.35 mg/dL — ABNORMAL LOW (ref 0.44–1.00)
GFR, Estimated: >60 mL/min (ref >60)
Glucose, Bld: 144 mg/dL — ABNORMAL HIGH (ref 70–99)
Potassium: 3.9 mmol/L (ref 3.5–5.1)
Sodium: 137 mmol/L (ref 135–145)
Total Bilirubin: 0.8 mg/dL (ref 0.3–1.2)
Total Protein: 6.8 g/dL (ref 6.5–8.1)

## 2021-12-30 LAB — RPR: RPR Ser Ql: NONREACTIVE

## 2021-12-30 LAB — MAGNESIUM: Magnesium: 4.6 mg/dL — ABNORMAL HIGH (ref 1.7–2.4)

## 2021-12-30 MED ORDER — NIFEDIPINE 10 MG PO CAPS
10.0000 mg | ORAL_CAPSULE | Freq: Once | ORAL | Status: DC
  Filled 2021-12-30: qty 1

## 2021-12-30 MED ORDER — NIFEDIPINE 10 MG PO CAPS
10.0000 mg | ORAL_CAPSULE | Freq: Once | ORAL | Status: AC
  Administered 2021-12-30: 10 mg via ORAL

## 2021-12-30 NOTE — Progress Notes (Signed)
Labor Progress Note  Donna Wolfe is a 21 y.o. G2P1001 at [redacted]w[redacted]d by LMP admitted for Preterm labor  Subjective: feeling more pain and pressure, grimacing with each contraction    Objective: BP (!) 104/58 (BP Location: Right Arm)   Pulse 95   Temp 97.9 F (36.6 C) (Oral)   Resp 18   Ht 5\' 2"  (1.575 m)   Wt 73.5 kg   LMP 05/25/2021 (Exact Date)   SpO2 95%   BMI 29.63 kg/m  Notable VS details: reviewed  Fetal Assessment: FHT:  FHR: 125 bpm, variability: moderate,  accelerations:  Present,  decelerations:  Absent Category/reactivity:  Category I UC:  Difficult to assess on toco but seems regular while in room with her SVE:   3/70/-2, soft/posterior; no bloody show Membrane status: intact Amniotic color: n/a  Labs: Results for orders placed or performed during the hospital encounter of 12/29/21 (from the past 24 hour(s))  Fetal fibronectin     Status: Abnormal   Collection Time: 12/29/21  5:41 PM  Result Value Ref Range   Fetal Fibronectin POSITIVE (A) NEGATIVE  Magnesium     Status: Abnormal   Collection Time: 12/30/21  3:12 AM  Result Value Ref Range   Magnesium 4.6 (H) 1.7 - 2.4 mg/dL      Assessment / Plan: G2P1001 at [redacted]w[redacted]d  Preterm labor  Pt feeling more painful UCs C/w Dr. [redacted]w[redacted]d and POC developed   Fetal Wellbeing - BMZ x 1 at 1749 yesterday - Due again around 1749 today - Mag infusing at 2gm/hr for neuro protection. Will consider turning down to 1g if UCs space out or possibly turning it off if UCs stop - neonatology consult yesterday, pt request another consult today for additional questions  Labor:   - Terb 0.25mg  x 1 dose at 1747 - Procardia IR 20mg  given yesterday at 1943, Procardia 10mg  was not given last night so repeated loading dose at 0810/0853 with expectations of continuing 10mg  q6hrs  Pain management - Fentanyl at 2122, 2246, 0249 - Tylenol given at 0810 for headache  Preeclampsia:  no e/o preE Fetal Wellbeing:  Category I Pain  Control:  IV pain meds I/D:   GBS neg Anticipated MOD:  NSVD  11/851, CNM 12/30/2021, 8:41 AM

## 2021-12-31 MED ORDER — ACETAMINOPHEN 325 MG PO TABS: 650.0000 mg | ORAL_TABLET | ORAL | Status: DC | PRN

## 2021-12-31 NOTE — Progress Notes (Signed)
Labor Progress Note  Donna Wolfe is a 21 y.o. G2P1001 at [redacted]w[redacted]d by LMP admitted for Preterm labor  Subjective: During the day yesterday and through the night tonight pt reports no pain.  Objective: BP 104/61 (BP Location: Right Arm)   Pulse (!) 103   Temp 99 F (37.2 C) (Oral)   Resp 18   Ht 5\' 2"  (1.575 m)   Wt 73.5 kg   LMP 05/25/2021 (Exact Date)   SpO2 96%   BMI 29.63 kg/m    Fetal Assessment: FHT:  FHR: 125 bpm, variability: moderate,  accelerations:  Present,  decelerations:  Absent Category/reactivity:  Category I UC:  quiet SVE:   12/31/21 at 0830  3/70/-2, soft/posterior; no bloody show Membrane status: intact Amniotic color: n/a  Labs: Results for orders placed or performed during the hospital encounter of 12/29/21 (from the past 24 hour(s))  Fetal fibronectin     Status: Abnormal   Collection Time: 12/29/21  5:41 PM  Result Value Ref Range   Fetal Fibronectin POSITIVE (A) NEGATIVE  Magnesium     Status: Abnormal   Collection Time: 12/30/21  3:12 AM  Result Value Ref Range   Magnesium 4.6 (H) 1.7 - 2.4 mg/dL      Assessment / Plan: G2P1001 at [redacted]w[redacted]d  Preterm labor  Fetal Wellbeing - BMZ  12/29/21 at 1749 and 12/30/21 at 1820 - Mag infusing at 2gm/hr for neuro protection. Stopped 12/30/21 at 1819  - neonatology consult completed  Labor:   - Procardia IR 20mg  given 12/30/21 at 1943 followed by Procardia 10mg  q6hrs  Pain management - Fentanyl at 2122, 2246, 0249 - Tylenol given at 0810 and 1154 for headache  Preeclampsia:  no e/o preE Fetal Wellbeing:  Category I Pain Control:  IV pain meds I/D:   GBS neg Anticipated MOD:  NSVD  , CNM 12/31/2021, 2:42 AM

## 2021-12-31 NOTE — Discharge Summary (Signed)
Patient ID: Donna Wolfe MRN: [id number] DOB/AGE: June 24, 2000 21 y.o.  Admit date: 12/29/2021 Discharge date: 12/31/2021  Admission Diagnoses: preterm labor, uterine contractions, 31 weeks  Discharge Diagnoses: uterine contractions, 31 weeks  Prenatal Care Site: [facility]  Prenatal Procedures: NST, tocolysis  Consults: Neonatology  Significant Diagnostic Studies:  Results for orders placed or performed during the hospital encounter of 12/29/21 (from the past 168 hour(s))  Group B strep by PCR   Collection Time: 12/29/21  5:35 PM  Result Value Ref Range   Group B strep by PCR NEGATIVE NEGATIVE  Urinalysis, Complete w Microscopic Urine, Clean Catch   Collection Time: 12/29/21  5:36 PM  Result Value Ref Range   Color, Urine STRAW (A) YELLOW   APPearance CLEAR (A) CLEAR   Specific Gravity, Urine 1.004 (L) 1.005 - 1.030   pH 7.0 5.0 - 8.0   Glucose, UA NEGATIVE NEGATIVE mg/dL   Hgb urine dipstick NEGATIVE NEGATIVE   Bilirubin Urine NEGATIVE NEGATIVE   Ketones, ur NEGATIVE NEGATIVE mg/dL   Protein, ur NEGATIVE NEGATIVE mg/dL   Nitrite NEGATIVE NEGATIVE   Leukocytes,Ua NEGATIVE NEGATIVE   WBC, UA 0-5 0 - 5 WBC/hpf   Bacteria, UA RARE (A) NONE SEEN   Squamous Epithelial / LPF 0-5 0 - 5  Urine Drug Screen, Qualitative ([facility] only)   Collection Time: 12/29/21  5:36 PM  Result Value Ref Range   Tricyclic, Ur Screen NONE DETECTED NONE DETECTED   Amphetamines, Ur Screen NONE DETECTED NONE DETECTED   MDMA (Ecstasy)Ur Screen NONE DETECTED NONE DETECTED   Cocaine Metabolite,Ur [place] NONE DETECTED NONE DETECTED   Opiate, Ur Screen NONE DETECTED NONE DETECTED   Phencyclidine (PCP) Ur S NONE DETECTED NONE DETECTED   Cannabinoid 50 Ng, Ur  NONE DETECTED NONE DETECTED   Barbiturates, Ur Screen NONE DETECTED NONE DETECTED   Benzodiazepine, Ur Scrn NONE DETECTED NONE DETECTED   Methadone Scn, Ur NONE DETECTED NONE DETECTED  Wet prep, genital   Collection Time: 12/29/21  5:40 PM   Result Value Ref Range   Yeast Wet Prep HPF POC NONE SEEN NONE SEEN   Trich, Wet Prep NONE SEEN NONE SEEN   Clue Cells Wet Prep HPF POC NONE SEEN NONE SEEN   WBC, Wet Prep HPF POC >=10 (A) <10   Sperm NONE SEEN   Chlamydia/NGC rt PCR ([facility] only)   Collection Time: 12/29/21  5:41 PM  Result Value Ref Range   Specimen source GC/Chlam ENDOCERVICAL    Chlamydia Tr NOT DETECTED NOT DETECTED   N gonorrhoeae NOT DETECTED NOT DETECTED  Fetal fibronectin   Collection Time: 12/29/21  5:41 PM  Result Value Ref Range   Fetal Fibronectin POSITIVE (A) NEGATIVE  CBC   Collection Time: 12/29/21  6:48 PM  Result Value Ref Range   WBC 11.8 (H) 4.0 - 10.5 K/uL   RBC 4.39 3.87 - 5.11 MIL/uL   Hemoglobin 11.3 (L) 12.0 - 15.0 g/dL   HCT 01.7 (L) 51.0 - 25.8 %   MCV 78.1 (L) 80.0 - 100.0 fL   MCH 25.7 (L) 26.0 - 34.0 pg   MCHC 32.9 30.0 - 36.0 g/dL   RDW 52.7 78.2 - 42.3 %   Platelets 336 150 - 400 K/uL   nRBC 0.0 0.0 - 0.2 %  RPR   Collection Time: 12/29/21  6:48 PM  Result Value Ref Range   RPR Ser Ql NON REACTIVE NON REACTIVE  Comprehensive metabolic panel   Collection Time: 12/29/21  6:48 PM  Result Value Ref Range   Sodium 136 135 - 145 mmol/L   Potassium 3.4 (L) 3.5 - 5.1 mmol/L   Chloride 106 98 - 111 mmol/L   CO2 21 (L) 22 - 32 mmol/L   Glucose, Bld 139 (H) 70 - 99 mg/dL   BUN 6 6 - 20 mg/dL   Creatinine, Ser 1.49 0.44 - 1.00 mg/dL   Calcium 8.3 (L) 8.9 - 10.3 mg/dL   Total Protein 6.5 6.5 - 8.1 g/dL   Albumin 2.7 (L) 3.5 - 5.0 g/dL   AST 23 15 - 41 U/L   ALT 8 0 - 44 U/L   Alkaline Phosphatase 133 (H) 38 - 126 U/L   Total Bilirubin 0.7 0.3 - 1.2 mg/dL   GFR, Estimated >70 >26 mL/min   Anion gap 9 5 - 15  Type and screen [facility]   Collection Time: 12/29/21  6:48 PM  Result Value Ref Range   ABO/RH(D) O POS    Antibody Screen NEG    Sample Expiration      01/01/2022,2359 Performed at [facility], [address] [place], [place]  [address]   Comprehensive metabolic panel   Collection Time: 12/30/21  3:12 AM  Result Value Ref Range   Sodium 137 135 - 145 mmol/L   Potassium 3.9 3.5 - 5.1 mmol/L   Chloride 109 98 - 111 mmol/L   CO2 20 (L) 22 - 32 mmol/L   Glucose, Bld 144 (H) 70 - 99 mg/dL   BUN <5 (L) 6 - 20 mg/dL   Creatinine, Ser 8.50 (L) 0.44 - 1.00 mg/dL   Calcium 7.5 (L) 8.9 - 10.3 mg/dL   Total Protein 6.8 6.5 - 8.1 g/dL   Albumin 2.9 (L) 3.5 - 5.0 g/dL   AST 23 15 - 41 U/L   ALT 9 0 - 44 U/L   Alkaline Phosphatase 136 (H) 38 - 126 U/L   Total Bilirubin 0.8 0.3 - 1.2 mg/dL   GFR, Estimated >27 >74 mL/min   Anion gap 8 5 - 15  Magnesium   Collection Time: 12/30/21  3:12 AM  Result Value Ref Range   Magnesium 4.6 (H) 1.7 - 2.4 mg/dL    Treatments: tocolysis: procardia, neuro protection: magnesium, fetal lung maturity: BMZ x 2  Hospital Course:  This is a 21 y.o. G2P1001 with IUP at [redacted]w[redacted]d admitted for preterm uterine contractions, noted to have a cervical exam of 3/70/-1.  No leaking of fluid and no bleeding.  She was initially started on magnesium sulfate for tocolysis and neuroprotection and also received betamethasone x 2 doses.  Her tocolysis was transitioned to Procardia. She was seen by Neonatology during her stay.  She was observed, fetal heart rate monitoring remained reassuring, and she had no signs/symptoms of progressing preterm labor or other maternal-fetal concerns.  Her cervical exam was unchanged from admission.  She was deemed stable for discharge to home with outpatient follow up. Dr. Jean Rosenthal agreeable with patient being discharged.   Discharge Physical Exam:   BP (!) 104/48 (BP Location: Left Arm)   Pulse 94   Temp 98.2 F (36.8 C) (Oral)   Resp 15   Ht 5\' 2"  (1.575 m)   Wt 73.5 kg   LMP 05/25/2021 (Exact Date)   SpO2 96%   BMI 29.63 kg/m   General: NAD CV: RRR Pulm: CTABL, nl effort ABD: s/nd/nt, gravid DVT Evaluation: LE non-ttp, no evidence of DVT on exam.  SVE: Dilation:  3 Effacement (%):  70 Cervical Position: Posterior Station: -2 Presentation: Vertex Exam by:: J Oxley CNM Fetal monitoring: Baseline: 125 bpm, Variability: Good {> 6 bpm), Accelerations: Reactive, and Decelerations: Absent  TOCO: none; soft to palpation   Discharge Condition: Stable  Disposition: Discharge disposition: 01-Home or Self Care        Allergies as of 12/31/2021   No Known Allergies      Medication List     TAKE these medications    acetaminophen 325 MG tablet Commonly known as: TYLENOL Take 2 tablets (650 mg total) by mouth every 4 (four) hours as needed (for pain scale < 4  OR  temperature  >/=  100.5 F).   prenatal multivitamin Tabs tablet Take 1 tablet by mouth daily at 12 noon.        Follow-up Information     [facility] DEPT. Call in 3 day(s).   Why: call for prenatal visit Contact information: [address] [place] [address] [phone]                Signed:  Janyce Llanos 12/31/2021 6:24 PM ----- Donato Schultz, CNM Certified Nurse Midwife [facility]

## 2021-12-31 NOTE — Progress Notes (Signed)
Labor Progress Note  Donna Wolfe is a 21 y.o. G2P1001 at [redacted]w[redacted]d by LMP admitted for Preterm labor  Subjective: she denies any contractions or pain  Objective: BP (!) 111/56 (BP Location: Left Arm)   Pulse 96   Temp 98.5 F (36.9 C) (Oral)   Resp 17   Ht 5\' 2"  (1.575 m)   Wt 73.5 kg   LMP 05/25/2021 (Exact Date)   SpO2 96%   BMI 29.63 kg/m  Notable VS details: reviewed  Fetal Assessment: FHT:  FHR: 110 bpm, variability: moderate,  accelerations:  Present,  decelerations:  Absent Category/reactivity:  Category I UC:   none SVE:  deferred Membrane status:intact Amniotic color: n/a  Labs: Lab Results  Component Value Date   WBC 11.8 (H) 12/29/2021   HGB 11.3 (L) 12/29/2021   HCT 34.3 (L) 12/29/2021   MCV 78.1 (L) 12/29/2021   PLT 336 12/29/2021    Assessment / Plan: 21 year old G2P1001 at [redacted]w[redacted]d with preterm labor  Labor:  No contractions since yesterday, she is feeling comfortable. Received two doses BMZ 12/29/21 at 1749 and 12/30/21 at 1819.  Magnesium for 24hrs, stopped 12/30/21 at 1819. Received procardia IR 10mg  q6hrs scheduled. Discussed with Dr. 01/01/22 and will d/c the procardia and monitor for contractions throughout the day. Encouraged continued contractions. If no contractions, will d/c home this evening with strict PTL precautions. If contractions return, will evaluate and manage as needed.  Preeclampsia:   no e/o pre-eclampsia Fetal Wellbeing:  Category I, low baseline, reactive Pain Control:   none needed, denies any complaints I/D:   GBS negative, intact  , CNM 12/31/2021, 11:01 AM

## 2021-12-31 NOTE — Progress Notes (Signed)
Pt given discharge instructions including labor precautions and follow up care. Pt verbalized understanding and discharged in stable condition with significant other and sister.

## 2022-01-05 NOTE — Telephone Encounter (Signed)
Patient returned call and follow up appointment scheduled.

## 2022-01-09 NOTE — L&D Delivery Note (Signed)
Delivery Note  Donna Wolfe is a G2P1001 at [redacted]w[redacted]d Patient's last menstrual period was 05/25/2021 (exact date)., consistent with [place] 257w0dEstimated Date of Delivery: 03/01/22   First Stage: Labor onset: 02/17/2022 at 0200 Augmentation: oxytocin and AROM Analgesia /Anesthesia intrapartum: Epidural AROM at 1940 GBS: negative  Second Stage: Complete dilation at 2050 Onset of pushing at 2050 FHR second stage 125 bpm with moderate, variable decels with pushing   Donna Wolfe presented to L&D with early labor.  She was admitted for intermittent category II tracing. She was initially expectantly managed.  Augmentation with oxytocin was initiated after contractions became less frequent.  AROM performed after epidural. She progressed well to C/C/+2 with an urge to push.  She pushed effectively over approximately 30 minutes for a spontaneous vaginal birth.  Delivery of a viable baby boy on 02/17/2022 at 2120 by CNM Delivery of fetal head in OA position with restitution to ROT. NO nuchal cord;  Anterior shoulders delivered easily with gentle downward traction. Baby rotated to LOT posterior shoulder quickly delivered. Right compound hand easily reduced.  Baby placed on mom's chest, and attended to by baby RN Cord double clamped after cessation of pulsation, cut by father of baby  Cord blood sample collection: Yes O POS  Third Stage: Oxytocin bolus started after delivery of infant for hemorrhage prophylaxis  Placenta delivered via schultz mechanism intact with 3 VC @ 2128 Placenta disposition: discarded Uterine tone firm / bleeding moderate  Small vaginal abrasions identified, hemostatic  Anesthesia for repair: N/A Repair not indicated  Est. Blood Loss (mL): [id number]  Complications: none  Mom to postpartum.  Baby to Couplet care / Skin to Skin.  Newborn: Information for the patient's newborn:  Donna, [id number] born female  Birth Weight:  pending  APGAR:  9, 9  Newborn Delivery   Birth date/time: 02/17/2022 21:20:00 Delivery type: Vaginal, Spontaneous       Feeding planned: breast feeding  ---------- AnDrinda ButtsCNM Certified Nurse Midwife [facility]

## 2022-01-10 ENCOUNTER — Ambulatory Visit: Payer: Self-pay | Admitting: Advanced Practice Midwife

## 2022-01-10 VITALS — BP 111/70 | HR 87 | Temp 97.6°F | Wt 165.6 lb

## 2022-01-10 DIAGNOSIS — O0933 Supervision of pregnancy with insufficient antenatal care, third trimester: Secondary | ICD-10-CM

## 2022-01-10 DIAGNOSIS — O09893 Supervision of other high risk pregnancies, third trimester: Secondary | ICD-10-CM

## 2022-01-10 DIAGNOSIS — O0930 Supervision of pregnancy with insufficient antenatal care, unspecified trimester: Secondary | ICD-10-CM

## 2022-01-10 DIAGNOSIS — Z3483 Encounter for supervision of other normal pregnancy, third trimester: Secondary | ICD-10-CM

## 2022-01-10 DIAGNOSIS — O09892 Supervision of other high risk pregnancies, second trimester: Secondary | ICD-10-CM

## 2022-01-10 DIAGNOSIS — Z91199 Patient's noncompliance with other medical treatment and regimen due to unspecified reason: Secondary | ICD-10-CM

## 2022-01-10 DIAGNOSIS — Z550 Illiteracy and low-level literacy: Secondary | ICD-10-CM

## 2022-01-10 DIAGNOSIS — Z3482 Encounter for supervision of other normal pregnancy, second trimester: Secondary | ICD-10-CM

## 2022-01-10 NOTE — Progress Notes (Signed)
Patient decided not to have flu vaccine today and would like to have at next RV.Marland KitchenJenetta Downer, RN

## 2022-01-10 NOTE — Progress Notes (Signed)
Patient here for MH RV at 69 6/7. Kick counts reviewed and cards given. Desires flu vaccine today.Jenetta Downer, RN

## 2022-01-10 NOTE — Progress Notes (Signed)
[facility]  PRENATAL VISIT NOTE  Subjective:  Donna Wolfe is a 22 y.o. G2P1001 at [redacted]w[redacted]d being seen today for ongoing prenatal care.  She is currently monitored for the following issues for this high-risk pregnancy and has Late prenatal care 16 6/7; Prenatal care, subsequent pregnancy, second trimester; Noncompliant pregnant patient with no care x 9 wks; Illiterate; and Preterm labor on their problem list.  Patient reports no complaints.  Contractions: Not present. Vag. Bleeding: None.  Movement: Present. Denies leaking of fluid/ROM.   The following portions of the patient's history were reviewed and updated as appropriate: allergies, current medications, past family history, past medical history, past social history, past surgical history and problem list. Problem list updated.  Objective:  There were no vitals filed for this visit.  Fetal Status: Fetal Heart Rate (bpm): 120 Fundal Height: 34 cm Movement: Present     General:  Alert, oriented and cooperative. Patient is in no acute distress.  Skin: Skin is warm and dry. No rash noted.   Cardiovascular: Normal heart rate noted  Respiratory: Normal respiratory effort, no problems with respiration noted  Abdomen: Soft, gravid, appropriate for gestational age.  Pain/Pressure: Absent     Pelvic: Cervical exam deferred        Extremities: Normal range of motion.  Edema: None  Mental Status: Normal mood and affect. Normal behavior. Normal judgment and thought content.   Assessment and Plan:  Pregnancy: G2P1001 at [redacted]w[redacted]d  1. Prenatal care, subsequent pregnancy, second trimester Not working Here with FOB and toddler son 12 lb (5.443 kg) 8 lb wt gain in past 4 wks 1 hour glucola=116 on 12/07/21 [facility] dental apt on 01/05/22 at [place] [facility]--encouraged to reschedule asap Not exercising   2. Late prenatal care 16 6/7   3. Noncompliant pregnant patient with no care x 9 wks No  prenatal care x 4 wks  4. Illiterate   5. Preterm labor with preterm delivery, single or unspecified fetus Hospitalized 12/29/21-12/31/21 for PTL SVE 70%/3/-1 and MgSo4 transitioned to Procardia, BMTZ x2 Pt states last child born at 51 wks Denies u/c's; counseled to increase rest and fluids. Knows when to go to L&D   Preterm labor symptoms and general obstetric precautions including but not limited to vaginal bleeding, contractions, leaking of fluid and fetal movement were reviewed in detail with the patient. Please refer to After Visit Summary for other counseling recommendations.  Return in about 2 weeks (around 01/24/2022) for routine PNC.  Future Appointments  Date Time Provider [place]  01/10/2022 10:00 AM Aundray Cartlidge, Real Cons, CNM [facility] None    Herbie Saxon, CNM

## 2022-01-20 NOTE — Addendum Note (Signed)
Addended by: Cletis Media on: 01/20/2022 03:28 PM   Modules accepted: Orders

## 2022-01-21 ENCOUNTER — Encounter: Payer: Self-pay | Admitting: Obstetrics and Gynecology

## 2022-01-21 ENCOUNTER — Observation Stay
Admission: EM | Admit: 2022-01-21 | Discharge: 2022-01-21 | Disposition: A | Discharge status: Home / Self Care | Payer: Self-pay | Attending: Obstetrics and Gynecology | Admitting: Obstetrics and Gynecology

## 2022-01-21 ENCOUNTER — Other Ambulatory Visit: Payer: Self-pay

## 2022-01-21 DIAGNOSIS — R82998 Other abnormal findings in urine: Secondary | ICD-10-CM | POA: Insufficient documentation

## 2022-01-21 DIAGNOSIS — Z3A34 34 weeks gestation of pregnancy: Secondary | ICD-10-CM | POA: Insufficient documentation

## 2022-01-21 DIAGNOSIS — O26893 Other specified pregnancy related conditions, third trimester: Principal | ICD-10-CM | POA: Insufficient documentation

## 2022-01-21 LAB — URINALYSIS, COMPLETE (UACMP) WITH MICROSCOPIC
Bilirubin Urine: NEGATIVE
Glucose, UA: NEGATIVE mg/dL
Hgb urine dipstick: NEGATIVE
Ketones, ur: NEGATIVE mg/dL
Nitrite: NEGATIVE
Protein, ur: NEGATIVE mg/dL
Specific Gravity, Urine: 1.013 (ref 1.005–1.030)
pH: 6 (ref 5.0–8.0)

## 2022-01-21 LAB — WET PREP, GENITAL
Clue Cells Wet Prep HPF POC: NONE SEEN
Sperm: NONE SEEN
Trich, Wet Prep: NONE SEEN
WBC, Wet Prep HPF POC: <10 (ref <10)
Yeast Wet Prep HPF POC: NONE SEEN

## 2022-01-21 LAB — FETAL FIBRONECTIN: Fetal Fibronectin: NEGATIVE

## 2022-01-21 MED ORDER — ACETAMINOPHEN 325 MG PO TABS: 650.0000 mg | ORAL_TABLET | ORAL | Status: DC | PRN

## 2022-01-21 MED ORDER — HYDROXYZINE HCL 25 MG PO TABS
25.0000 mg | ORAL_TABLET | Freq: Once | ORAL | Status: AC
  Administered 2022-01-21: 25 mg via ORAL
  Filled 2022-01-21: qty 1

## 2022-01-21 MED ORDER — LACTATED RINGERS IV SOLN: INTRAVENOUS | Status: DC

## 2022-01-21 MED ORDER — ACETAMINOPHEN 500 MG PO TABS
1000.0000 mg | ORAL_TABLET | Freq: Four times a day (QID) | ORAL | Status: DC | PRN
  Administered 2022-01-21: 1000 mg via ORAL
  Filled 2022-01-21: qty 2

## 2022-01-21 MED ORDER — LACTATED RINGERS IV BOLUS
1000.0000 mL | Freq: Once | INTRAVENOUS | Status: AC
  Administered 2022-01-21: 1000 mL via INTRAVENOUS

## 2022-01-21 NOTE — OB Triage Note (Addendum)
Patient is a G2P1001 at [place] coming in for contraction like pain in her lower abdomen and pelvic area that began around 6pm. Patient denies bleeding, but reports white discharge. Patient reports +FM. Initial FHT 130bpm.

## 2022-01-21 NOTE — Discharge Summary (Signed)
Donna Wolfe is a 22 y.o. female. She is at [redacted]w[redacted]d gestation. Patient's last menstrual period was 05/25/2021 (exact date). 03/01/2022, by Last Menstrual Period   Prenatal care site: [facility]  Chief complaint: lower pelvic pain   HPI: Donna Wolfe presents to L&D with complaints of lower pelvic pain.  She was previously admitted for threatened preterm labor, given mag sulfate, and betamethasone x 2.  States that pain started around 1800 yesterday.  Denies vaginal bleeding or LOF.  Endorses good fetal movement.   Factors complicating pregnancy: 1. Limited prenatal care, s/p 3 visits at [facility] 2. Language barrier, spanish speaking only, also illiterate.  S: Resting comfortably., no VB.no LOF,  Active fetal movement.   Maternal Medical History:  Past Medical Hx:  has a past medical history of Medical history non-contributory, Patient denies medical problems, and Preterm labor (12/29/2021).    Past Surgical Hx:  has a past surgical history that includes denies.   No Known Allergies   Prior to Admission medications   Medication Sig Start Date End Date Taking? Authorizing Provider  acetaminophen (TYLENOL) 325 MG tablet Take 2 tablets (650 mg total) by mouth every 4 (four) hours as needed (for pain scale < 4  OR  temperature  >/=  100.5 F). Patient not taking: Reported on 01/10/2022 12/31/21   Donna Wolfe, CNM  Prenatal Vit-Fe Fumarate-FA (PRENATAL MULTIVITAMIN) TABS tablet Take 1 tablet by mouth daily at 12 noon.    [provider]    Social History: She  reports that she has never smoked. She has never been exposed to tobacco smoke. She has never used smokeless tobacco. She reports that she does not drink alcohol and does not use drugs.  Family History: family history includes Diabetes in her maternal grandmother; Healthy in her brother, brother, father, maternal grandfather, mother, paternal grandfather, paternal grandmother, sister, sister, and son. ,cancers   Review of  Systems: A full review of systems was performed and negative except as noted in the HPI.     Pertinent Results:  Prenatal Labs: Blood type/Rh O POS   Antibody screen Negative    Rubella 6.55 (09/12 1052)   Varicella Immune  RPR NON REACTIVE (12/21 1848)   HBsAg Negative (09/12 1052)  Hep C NR   HIV Non Reactive (09/12 1052)   GC neg  Chlamydia neg  Genetic screening cfDNA negative/AFP neg  1 hour GTT 116  3 hour GTT N/A  GBS NEGATIVE/-- (12/21 1735)     O:  BP 116/69 (BP Location: Right Arm)   Pulse (!) 101   Temp 98 F (36.7 C) (Oral)   LMP 05/25/2021 (Exact Date)  Results for orders placed or performed during the hospital encounter of 01/21/22 (from the past 48 hour(s))  Wet prep, genital   Collection Time: 01/21/22  4:22 AM  Result Value Ref Range   Yeast Wet Prep HPF POC NONE SEEN NONE SEEN   Trich, Wet Prep NONE SEEN NONE SEEN   Clue Cells Wet Prep HPF POC NONE SEEN NONE SEEN   WBC, Wet Prep HPF POC <10 <10   Sperm NONE SEEN   Urinalysis, Complete w Microscopic Urine, Clean Catch   Collection Time: 01/21/22  4:22 AM  Result Value Ref Range   Color, Urine YELLOW (A) YELLOW   APPearance HAZY (A) CLEAR   Specific Gravity, Urine 1.013 1.005 - 1.030   pH 6.0 5.0 - 8.0   Glucose, UA NEGATIVE NEGATIVE mg/dL   Hgb urine dipstick NEGATIVE NEGATIVE  Bilirubin Urine NEGATIVE NEGATIVE   Ketones, ur NEGATIVE NEGATIVE mg/dL   Protein, ur NEGATIVE NEGATIVE mg/dL   Nitrite NEGATIVE NEGATIVE   Leukocytes,Ua LARGE (A) NEGATIVE   RBC / HPF 0-5 0 - 5 RBC/hpf   WBC, UA 21-50 0 - 5 WBC/hpf   Bacteria, UA RARE (A) NONE SEEN   Squamous Epithelial / HPF 6-10 0 - 5 /HPF   Mucus PRESENT   Fetal fibronectin   Collection Time: 01/21/22  4:22 AM  Result Value Ref Range   Fetal Fibronectin NEGATIVE NEGATIVE     Constitutional: NAD, AAOx3  HE/ENT: extraocular movements grossly intact, moist mucous membranes CV: RRR PULM: nl respiratory effort Abd: gravid, non-tender,  non-distended, soft  Ext: Non-tender, Nonedmeatous Psych: mood appropriate, speech normal SVE: Dilation: 1 Effacement (%): Thick Cervical Position: Posterior Station: Ballotable Exam by:: Donna Wolfe CNM  -Unchanged after 4 hours   NST: Baseline FHR: 125 beats/min Variability: moderate Accelerations: present Decelerations: absent Tocometry: Irregular mild contractions   Interpretation:  INDICATIONS: Preterm contractions  RESULTS:  A NST procedure was performed with FHR monitoring and a normal baseline established, appropriate time of 20-40 minutes of evaluation, and accels >2 seen w 15x15 characteristics.  Results show a REACTIVE NST.   Assessment: 22 y.o. G2P1001 [redacted]w[redacted]d 03/01/2022, by Last Menstrual Period   Principle diagnosis: Preterm labor [O60.00]   Plan: 1) Reactive NST  -Category 1 tracing  -Reassuring fetal status   2) Labor: Not present  -Cervix unchanged after several hours  -Wet prep, FFN negative  -Urine culture pending  -Discussed warning signs to return to L&D triage with  -Reviewed comfort measures   3) Disposition: discharge home stable -Precautions reviewed  -Follow up as scheduled this Wednesday with [facility]    ----- Donna Wolfe, CNM Certified Nurse Midwife [facility]

## 2022-01-21 NOTE — H&P (Signed)
OB History & Physical   History of Present Illness:  Chief Complaint:   HPI:  Shamia Dellis Anes is a 22 y.o. G8P1001 female at [redacted]w[redacted]d dated by 19 week u/s.  She presents to L&D for constant lower pelvic pain. Previously admitted for preterm labor.  She was given BMX x2 doses and Mag sulfate for neuro protection.  She reports:  -active fetal movement -no leakage of fluid -no vaginal bleeding  Pregnancy Issues: 1. 1. Noncompliant with prenatal care, s/p 3 visits at [facility] 2. Language barrier, spanish speaking only, also illiterate.   Maternal Medical History:   Past Medical History:  Diagnosis Date   Medical history non-contributory    Patient denies medical problems    Preterm labor 12/29/2021    Past Surgical History:  Procedure Laterality Date   denies      No Known Allergies  Prior to Admission medications   Medication Sig Start Date End Date Taking? Authorizing Provider  acetaminophen (TYLENOL) 325 MG tablet Take 2 tablets (650 mg total) by mouth every 4 (four) hours as needed (for pain scale < 4  OR  temperature  >/=  100.5 F). Patient not taking: Reported on 01/10/2022 12/31/21   Gertie Fey, CNM  Prenatal Vit-Fe Fumarate-FA (PRENATAL MULTIVITAMIN) TABS tablet Take 1 tablet by mouth daily at 12 noon.    [provider]     Prenatal care site: [facility]   Social History: She  reports that she has never smoked. She has never been exposed to tobacco smoke. She has never used smokeless tobacco. She reports that she does not drink alcohol and does not use drugs.  Family History: family history includes Diabetes in her maternal grandmother; Healthy in her brother, brother, father, maternal grandfather, mother, paternal grandfather, paternal grandmother, sister, sister, and son.   Review of Systems: A full review of systems was performed and negative except as noted in the HPI.    Physical Exam:  Vital Signs: BP 116/69 (BP  Location: Right Arm)   Pulse (!) 101   Temp 98 F (36.7 C) (Oral)   LMP 05/25/2021 (Exact Date)   General:   alert and cooperative  Skin:  normal  Neurologic:    Alert & oriented x 3  Lungs:    Nl effort  Heart:   regular rate and rhythm  Abdomen:  soft, non-tender; bowel sounds normal; no masses,  no organomegaly  Extremities: : non-tender, symmetric, no edema bilaterally.        Pertinent Results:  Prenatal Labs: Blood type/Rh O Pos  Antibody screen neg  Rubella Immune  Varicella  Hx Varivax x 2  RPR NR  HBsAg Neg  HIV NR  GC neg  Chlamydia neg  Genetic screening Negative materniT21 and AFP  1 hour GTT  116  3 hour GTT    GBS  Neg on 12/29/21   FHT: FHR: 125 bpm, variability: moderate,  accelerations:  Present,  decelerations:  Absent Category/reactivity:  Category I TOCO: UI SVE: Dilation: 1 / Effacement (%): 70 / Station: Ballotable      Assessment:  Jennier Schissler is a 22 y.o. G31P1001 female at [redacted]w[redacted]d with lower pelvic pain.   Plan:  1. Admit to triage  2. Lower pelvic pain - constant - fFN  - Wet prep - UA - IVF and bolus  3. Fetal Well being  - Fetal Tracing: Cat I - GBS neg - Presentation: vtx confirmed by sve  Linda Hedges, CNM 01/21/2022 4:14 AM

## 2022-01-22 LAB — URINE CULTURE

## 2022-01-25 ENCOUNTER — Ambulatory Visit: Payer: Self-pay | Admitting: Advanced Practice Midwife

## 2022-01-25 VITALS — BP 110/74 | HR 100 | Temp 97.7°F | Wt 168.4 lb

## 2022-01-25 DIAGNOSIS — Z23 Encounter for immunization: Secondary | ICD-10-CM

## 2022-01-25 DIAGNOSIS — O09893 Supervision of other high risk pregnancies, third trimester: Secondary | ICD-10-CM

## 2022-01-25 DIAGNOSIS — Z91199 Patient's noncompliance with other medical treatment and regimen due to unspecified reason: Secondary | ICD-10-CM

## 2022-01-25 DIAGNOSIS — O09892 Supervision of other high risk pregnancies, second trimester: Secondary | ICD-10-CM

## 2022-01-25 DIAGNOSIS — Z550 Illiteracy and low-level literacy: Secondary | ICD-10-CM

## 2022-01-25 DIAGNOSIS — Z3482 Encounter for supervision of other normal pregnancy, second trimester: Secondary | ICD-10-CM

## 2022-01-25 DIAGNOSIS — O0930 Supervision of pregnancy with insufficient antenatal care, unspecified trimester: Secondary | ICD-10-CM

## 2022-01-25 DIAGNOSIS — Z3483 Encounter for supervision of other normal pregnancy, third trimester: Secondary | ICD-10-CM

## 2022-01-25 DIAGNOSIS — O0933 Supervision of pregnancy with insufficient antenatal care, third trimester: Secondary | ICD-10-CM

## 2022-01-25 NOTE — Progress Notes (Signed)
[place] [facility]  PRENATAL VISIT NOTE  Subjective:  Donna Wolfe is a 22 y.o. G2P1001 at [redacted]w[redacted]d being seen today for ongoing prenatal care.  She is currently monitored for the following issues for this high-risk pregnancy and has Late prenatal care 16 6/7; Prenatal care, subsequent pregnancy, second trimester; Noncompliant pregnant patient with no care x 9 wks; Illiterate; and Preterm labor on their problem list.  Patient reports  contractions<4x/hour with hospitalization on 01/21/22 .  Contractions: Not present. Vag. Bleeding: None.  Movement: Present. Denies leaking of fluid/ROM.   The following portions of the patient's history were reviewed and updated as appropriate: allergies, current medications, past family history, past medical history, past social history, past surgical history and problem list. Problem list updated.  Objective:   Vitals:   01/25/22 0900  BP: 110/74  Pulse: 100  Temp: 97.7 F (36.5 C)  Weight: 168 lb 6.4 oz (76.4 kg)    Fetal Status: Fetal Heart Rate (bpm): 120 Fundal Height: 34 cm Movement: Present     General:  Alert, oriented and cooperative. Patient is in no acute distress.  Skin: Skin is warm and dry. No rash noted.   Cardiovascular: Normal heart rate noted  Respiratory: Normal respiratory effort, no problems with respiration noted  Abdomen: Soft, gravid, appropriate for gestational age.  Pain/Pressure: Absent     Pelvic: Cervical exam deferred        Extremities: Normal range of motion.  Edema: None  Mental Status: Normal mood and affect. Normal behavior. Normal judgment and thought content.   Assessment and Plan:  Pregnancy: G2P1001 at [redacted]w[redacted]d  1. Prenatal care, subsequent pregnancy, second trimester Here with FOB Hospitalized 01/21/22 for PTL (this is second time this pregnancy; last time given MgS04 and BMTZ) Counseled to increase rest, fluids, go to dentist [facility] 01/05/22 [facility] dental clinic apt)   18 lb 6.4 oz (8.346 kg) Knows when to go to L&D - Flu Vaccine QUAD 16mo+IM (Fluarix, Fluzone & Alfiuria Quad PF)  2. Noncompliant pregnant patient with no care x 9 wks   3. Late prenatal care 16 6/7   4. Illiterate    Preterm labor symptoms and general obstetric precautions including but not limited to vaginal bleeding, contractions, leaking of fluid and fetal movement were reviewed in detail with the patient. Please refer to After Visit Summary for other counseling recommendations.  Return in about 1 week (around 02/01/2022) for routine PNC.  Future Appointments  Date Time Provider [place]  02/01/2022  9:00 AM [facility] PROVIDER [facility] None    Donna Wolfe, CNM

## 2022-02-01 ENCOUNTER — Ambulatory Visit: Payer: Self-pay | Admitting: Family

## 2022-02-01 VITALS — BP 113/73 | HR 86 | Temp 97.7°F | Wt 167.2 lb

## 2022-02-01 DIAGNOSIS — Z3483 Encounter for supervision of other normal pregnancy, third trimester: Secondary | ICD-10-CM

## 2022-02-01 DIAGNOSIS — Z3482 Encounter for supervision of other normal pregnancy, second trimester: Secondary | ICD-10-CM

## 2022-02-01 NOTE — Progress Notes (Unsigned)
[place] [facility]  PRENATAL VISIT NOTE  Subjective:  Donna Wolfe is a 22 y.o. G2P1001 at [redacted]w[redacted]d being seen today for ongoing prenatal care.  She is currently monitored for the following issues for this low-risk pregnancy and has Late prenatal care 16 6/7; Prenatal care, subsequent pregnancy, second trimester; Noncompliant pregnant patient with no care x 9 wks; Illiterate; and Preterm labor on their problem list.  Patient reports having occasional contractions 3 days ago that lasted during the night and half of the next day, resolved spontaneously without intervention.  Contractions: Irregular. Vag. Bleeding: None.  Movement: Present. Denies leaking of fluid/ROM.   The following portions of the patient's history were reviewed and updated as appropriate: allergies, current medications, past family history, past medical history, past social history, past surgical history and problem list. Problem list updated.  Objective:   Vitals:   02/01/22 0915  BP: 113/73  Pulse: 86  Temp: 97.7 F (36.5 C)  Weight: 167 lb 3.2 oz (75.8 kg)    Fetal Status: Fetal Heart Rate (bpm): 140 Fundal Height: 36 cm Movement: Present     General:  Alert, oriented and cooperative. Patient is in no acute distress.  Skin: Skin is warm and dry. No rash noted.   Cardiovascular: Normal heart rate noted  Respiratory: Normal respiratory effort, no problems with respiration noted  Abdomen: Soft, gravid, appropriate for gestational age.  Pain/Pressure: Present     Pelvic: Cervical exam deferred        Extremities: Normal range of motion.  Edema: None  Mental Status: Normal mood and affect. Normal behavior. Normal judgment and thought content.   Assessment and Plan:  Pregnancy: G2P1001 at [redacted]w[redacted]d  1. Prenatal care, subsequent pregnancy, second trimester 17 lb 3.2 oz (7.802 kg)  Discussed adequate hydration- 8-10 glasses water per day. Encouraged to decide on birth  control method before delivery- currently unsure - GBS Culture - Chlamydia/GC NAA, Confirmation  2. Preterm labor with preterm delivery, single or unspecified fetus Discussed reasons to go to the hospital  Preterm labor symptoms and general obstetric precautions including but not limited to vaginal bleeding, contractions, leaking of fluid and fetal movement were reviewed in detail with the patient. Please refer to After Visit Summary for other counseling recommendations.  Return in about 1 week (around 02/08/2022) for routine prenatal care.  Future Appointments  Date Time Provider [place]  02/10/2022  2:40 PM [facility] PROVIDER [facility] None    Marline Backbone, FNP

## 2022-02-05 LAB — CHLAMYDIA/GC NAA, CONFIRMATION
Chlamydia trachomatis, NAA: NEGATIVE
Neisseria gonorrhoeae, NAA: NEGATIVE

## 2022-02-05 LAB — CULTURE, BETA STREP (GROUP B ONLY): Strep Gp B Culture: NEGATIVE

## 2022-02-06 ENCOUNTER — Encounter: Payer: Self-pay | Admitting: Obstetrics and Gynecology

## 2022-02-06 ENCOUNTER — Inpatient Hospital Stay
Admission: EM | Admit: 2022-02-06 | Discharge: 2022-02-07 | DRG: 833 | Disposition: A | Discharge status: Home / Self Care | Payer: Self-pay | Attending: Certified Nurse Midwife | Admitting: Certified Nurse Midwife

## 2022-02-06 ENCOUNTER — Other Ambulatory Visit: Payer: Self-pay

## 2022-02-06 DIAGNOSIS — O4700 False labor before 37 completed weeks of gestation, unspecified trimester: Principal | ICD-10-CM | POA: Diagnosis present

## 2022-02-06 DIAGNOSIS — Z3A36 36 weeks gestation of pregnancy: Secondary | ICD-10-CM

## 2022-02-06 DIAGNOSIS — O0933 Supervision of pregnancy with insufficient antenatal care, third trimester: Secondary | ICD-10-CM

## 2022-02-06 DIAGNOSIS — O368130 Decreased fetal movements, third trimester, not applicable or unspecified: Secondary | ICD-10-CM | POA: Diagnosis present

## 2022-02-06 DIAGNOSIS — O479 False labor, unspecified: Secondary | ICD-10-CM | POA: Diagnosis present

## 2022-02-06 LAB — CBC
HCT: 36.7 % (ref 36.0–46.0)
Hemoglobin: 11.7 g/dL — ABNORMAL LOW (ref 12.0–15.0)
MCH: 24.1 pg — ABNORMAL LOW (ref 26.0–34.0)
MCHC: 31.9 g/dL (ref 30.0–36.0)
MCV: 75.7 fL — ABNORMAL LOW (ref 80.0–100.0)
Platelets: 353 10*3/uL (ref 150–400)
RBC: 4.85 MIL/uL (ref 3.87–5.11)
RDW: 15.1 % (ref 11.5–15.5)
WBC: 10.3 10*3/uL (ref 4.0–10.5)
nRBC: 0 % (ref 0.0–0.2)

## 2022-02-06 LAB — URINALYSIS, COMPLETE (UACMP) WITH MICROSCOPIC
Bacteria, UA: NONE SEEN
Bilirubin Urine: NEGATIVE
Glucose, UA: NEGATIVE mg/dL
Hgb urine dipstick: NEGATIVE
Ketones, ur: NEGATIVE mg/dL
Nitrite: NEGATIVE
Protein, ur: 30 mg/dL — AB
Specific Gravity, Urine: 1.019 (ref 1.005–1.030)
pH: 6 (ref 5.0–8.0)

## 2022-02-06 LAB — TYPE AND SCREEN
ABO/RH(D): O POS
Antibody Screen: NEGATIVE

## 2022-02-06 MED ORDER — ONDANSETRON HCL 4 MG/2ML IJ SOLN: 4.0000 mg | Freq: Four times a day (QID) | INTRAMUSCULAR | Status: DC | PRN

## 2022-02-06 MED ORDER — LACTATED RINGERS IV SOLN
500.0000 mL | INTRAVENOUS | Status: DC | PRN
  Administered 2022-02-06: 500 mL via INTRAVENOUS

## 2022-02-06 MED ORDER — FENTANYL CITRATE (PF) 100 MCG/2ML IJ SOLN
50.0000 ug | INTRAMUSCULAR | Status: DC | PRN
  Administered 2022-02-06: 50 ug via INTRAVENOUS
  Administered 2022-02-07: 100 ug via INTRAVENOUS
  Filled 2022-02-06 (×2): qty 2

## 2022-02-06 MED ORDER — CALCIUM CARBONATE ANTACID 500 MG PO CHEW: 2.0000 | CHEWABLE_TABLET | ORAL | Status: DC | PRN

## 2022-02-06 MED ORDER — LACTATED RINGERS IV SOLN: INTRAVENOUS | Status: DC

## 2022-02-06 MED ORDER — SOD CITRATE-CITRIC ACID 500-334 MG/5ML PO SOLN: 30.0000 mL | ORAL | Status: DC | PRN

## 2022-02-06 MED ORDER — OXYTOCIN BOLUS FROM INFUSION: 333.0000 mL | Freq: Once | INTRAVENOUS | Status: DC

## 2022-02-06 MED ORDER — ACETAMINOPHEN 325 MG PO TABS: 650.0000 mg | ORAL_TABLET | ORAL | Status: DC | PRN

## 2022-02-06 MED ORDER — OXYTOCIN-SODIUM CHLORIDE 30-0.9 UT/500ML-% IV SOLN
2.5000 [IU]/h | INTRAVENOUS | Status: DC
  Filled 2022-02-06: qty 500

## 2022-02-06 MED ORDER — LIDOCAINE HCL (PF) 1 % IJ SOLN: 30.0000 mL | INTRAMUSCULAR | Status: DC | PRN

## 2022-02-06 NOTE — OB Triage Note (Signed)
Patient is a G2P1 at [redacted]w[redacted]d who presents to unit c/o ctx q2-37min apart since 1400. Reports decreased fetal movement since ctx started. Denies LOF and vaginal bleeding. External monitors applied and assessing. Initial FHT 120. Vital signs WDL.

## 2022-02-06 NOTE — H&P (Signed)
OB History & Physical   History of Present Illness:  Chief Complaint:   HPI:  Donna Wolfe is a 22 y.o. G54P1001 female at [redacted]w[redacted]d dated by 19wk [place].  She presents to L&D for uterine contractions since this morning which have become stronger and closer together. She now rates her contractions 9/10.    Pregnancy Issues: Insufficient prenatal care (6 total prenatal visits with [facility]) Language barrier, Spanish speaking only, illiterate Preterm labor, had previously received magnesium and BMZ in December   Maternal Medical History:   Past Medical History:  Diagnosis Date   Medical history non-contributory    Patient denies medical problems    Preterm labor 12/29/2021    Past Surgical History:  Procedure Laterality Date   denies      No Known Allergies  Prior to Admission medications   Medication Sig Start Date End Date Taking? Authorizing Provider  Prenatal Vit-Fe Fumarate-FA (PRENATAL MULTIVITAMIN) TABS tablet Take 1 tablet by mouth daily at 12 noon.    [provider]    Prenatal care site: [facility]   Social History: She  reports that she has never smoked. She has never been exposed to tobacco smoke. She has never used smokeless tobacco. She reports that she does not drink alcohol and does not use drugs.  Family History: family history includes Diabetes in her maternal grandmother; Healthy in her brother, brother, father, maternal grandfather, mother, paternal grandfather, paternal grandmother, sister, sister, and son.   Review of Systems: A full review of systems was performed and negative except as noted in the HPI.     Physical Exam:  Vital Signs: BP 132/73 (BP Location: Left Arm)   Pulse (!) 107   Temp 97.8 F (36.6 C) (Oral)   Resp 18   Ht 5\' 2"  (1.575 m)   Wt 74.4 kg   LMP 05/25/2021 (Exact Date)   BMI 30.00 kg/m  General: no acute distress.  HEENT: normocephalic, atraumatic Heart: regular rate & rhythm.  No  murmurs/rubs/gallops Lungs: clear to auscultation bilaterally, normal respiratory effort Abdomen: soft, gravid, non-tender;  EFW: 6.5lb Pelvic:   External: Normal external female genitalia  Cervix: Dilation: 3 / Effacement (%): 50 / Station: -2    Extremities: non-tender, symmetric, mild edema bilaterally.  DTRs: +2  Neurologic: Alert & oriented x 3.    Results for orders placed or performed during the hospital encounter of 02/06/22 (from the past 24 hour(s))  Urinalysis, Complete w Microscopic -Urine, Clean Catch     Status: Abnormal   Collection Time: 02/06/22  5:44 PM  Result Value Ref Range   Color, Urine YELLOW (A) YELLOW   APPearance HAZY (A) CLEAR   Specific Gravity, Urine 1.019 1.005 - 1.030   pH 6.0 5.0 - 8.0   Glucose, UA NEGATIVE NEGATIVE mg/dL   Hgb urine dipstick NEGATIVE NEGATIVE   Bilirubin Urine NEGATIVE NEGATIVE   Ketones, ur NEGATIVE NEGATIVE mg/dL   Protein, ur 30 (A) NEGATIVE mg/dL   Nitrite NEGATIVE NEGATIVE   Leukocytes,Ua TRACE (A) NEGATIVE   RBC / HPF 0-5 0 - 5 RBC/hpf   WBC, UA 6-10 0 - 5 WBC/hpf   Bacteria, UA NONE SEEN NONE SEEN   Squamous Epithelial / HPF 6-10 0 - 5 /HPF   Mucus PRESENT     Pertinent Results:  Prenatal Labs: Blood type/Rh O pos  Antibody screen neg  Rubella Immune  Varicella Unknown  RPR NR  HBsAg Neg  HIV NR  GC neg  Chlamydia neg  Genetic screening negative  1 hour GTT 116  3 hour GTT   GBS Negative   FHT: 120bpm, moderate variability, accelerations present, no decelerations, Category I tracing TOCO: contractions q2-85min, palpate moderate SVE:  Dilation: 3 / Effacement (%): 50 / Station: -2    Cephalic by leopolds  No results found.  Assessment:  Donna Wolfe is a 22 y.o. G57P1001 female at [redacted]w[redacted]d with preterm labor.   Plan:  1. Admit to Labor & Delivery; consents reviewed and obtained - Dr. Glennon Mac made aware of admission  2. Fetal Well being  - Fetal Tracing: Category I tracing - Group B  Streptococcus ppx indicated: n/a, GBS negative - Presentation: vertex confirmed by SVE   3. Routine OB: - Prenatal labs reviewed, as above - Rh pos - CBC, T&S, RPR on admit - Clear fluids, IVF  4. Monitoring of Labor -  Contractions q2-46min, external toco in place -  Pelvis proven to 2930g -  Plan for continuous fetal monitoring  -  Maternal pain control as desired; planning epidural once contractions are stronger - Due to preterm labor, will not augment labor unless 6+ cm  - Anticipate vaginal delivery  5. Post Partum Planning: - Infant feeding: breast and bottle feeding - Contraception: TBD  Gertie Fey, CNM 02/06/22 8:34 PM

## 2022-02-07 ENCOUNTER — Encounter: Payer: Self-pay | Admitting: Obstetrics and Gynecology

## 2022-02-07 LAB — RPR: RPR Ser Ql: NONREACTIVE

## 2022-02-07 MED ORDER — LIDOCAINE HCL (PF) 1 % IJ SOLN
INTRAMUSCULAR | Status: AC
  Filled 2022-02-07: qty 30

## 2022-02-07 MED ORDER — MISOPROSTOL 200 MCG PO TABS
ORAL_TABLET | ORAL | Status: AC
  Filled 2022-02-07: qty 4

## 2022-02-07 MED ORDER — OXYTOCIN 10 UNIT/ML IJ SOLN
INTRAMUSCULAR | Status: AC
  Filled 2022-02-07: qty 2

## 2022-02-07 MED ORDER — AMMONIA AROMATIC IN INHA
RESPIRATORY_TRACT | Status: AC
  Filled 2022-02-07: qty 10

## 2022-02-07 NOTE — Progress Notes (Signed)
Labor Progress Note  Donna Wolfe is a 22 y.o. G2P1001 at [redacted]w[redacted]d by ultrasound admitted for uterine contractions with cervical change  Subjective: Feeling occasional contractions  Objective: BP 102/63 (BP Location: Right Arm)   Pulse 74   Temp 98.3 F (36.8 C) (Oral)   Resp 17   Ht 5\' 2"  (1.575 m)   Wt 74.4 kg   LMP 05/25/2021 (Exact Date)   BMI 30.00 kg/m   Fetal Assessment: FHT:  FHR: 115 bpm, variability: moderate,  accelerations:  Present,  decelerations:  Absent Category/reactivity:  Category I UC:   occasional  SVE:    Dilation: 1cm  Effacement: 50%  Station:  -3  Consistency: soft  Position: posterior  Membrane status:intact Amniotic color: n/a  Labs: Lab Results  Component Value Date   WBC 10.3 02/06/2022   HGB 11.7 (L) 02/06/2022   HCT 36.7 02/06/2022   MCV 75.7 (L) 02/06/2022   PLT 353 02/06/2022    Assessment / Plan: 22 year old G2P1001 at [redacted]w[redacted]d here with uterine contractions and cervical change  Labor:  No labor at this time Preeclampsia:   102/63 Fetal Wellbeing:  Category I Pain Control:  IV pain meds Last dose of Fentanyl 1/30 at 0010 I/D:   Afebrile, GBS negative, Intact Anticipated MOD:  NSVD  Clydene Laming, CNM 02/07/2022, 8:48 AM

## 2022-02-07 NOTE — OB Triage Note (Signed)
Discharge instructions, labor precautions, and follow-up care reviewed with patient and significant other. All questions answered. Patient verbalized understanding. Discharged ambulatory off unit.   

## 2022-02-07 NOTE — Progress Notes (Signed)
Labor Progress Note  Donna Wolfe is a 22 y.o. G2P1001 at [redacted]w[redacted]d by ultrasound admitted for uterine contractions with cervical change  Subjective: she reports the contractions are stronger and more uncomfortable, requesting another dose of IV pain medication  Objective: BP 120/72 (BP Location: Left Arm)   Pulse 75   Temp 98.8 F (37.1 C) (Oral)   Resp 18   Ht 5\' 2"  (1.575 m)   Wt 74.4 kg   LMP 05/25/2021 (Exact Date)   BMI 30.00 kg/m  Notable VS details: reviewed  Fetal Assessment: FHT:  FHR: 120 bpm, variability: moderate,  accelerations:  Present,  decelerations:  Absent Category/reactivity:  Category I UC:   regular, every 2-4 minutes SVE:    Dilation: 4cm  Effacement: 80%  Station:  -2  Consistency: soft  Position: posterior  Membrane status:intact Amniotic color: n/a  Labs: Lab Results  Component Value Date   WBC 10.3 02/06/2022   HGB 11.7 (L) 02/06/2022   HCT 36.7 02/06/2022   MCV 75.7 (L) 02/06/2022   PLT 353 02/06/2022    Assessment / Plan: 22 year old G2P1001 at [redacted]w[redacted]d here with uterine contractions and cervical change  Labor:  She has progressed from 1cm to 4cm, rates the contractions as stronger. No augmentation at this time. Preeclampsia:  no signs or symptoms of toxicity Fetal Wellbeing:  Category I Pain Control:  IV pain meds I/D:   GBS negative Anticipated MOD:  NSVD  Gertie Fey, CNM 02/07/2022, 12:54 AM

## 2022-02-07 NOTE — Discharge Summary (Signed)
Patient ID: Donna Wolfe MRN: [id number] DOB/AGE: 21/12/2000 21 y.o.  Admit date: 02/06/2022 Discharge date: 02/07/2022  Admission Diagnoses: 22 y.o. G2P1001 female at [redacted]w[redacted]d dated by a 19 week [place].  She presented to L&D for uterine contractions which became stronger and closer together.   Discharge Diagnoses: No active preterm labor  Factors complicating pregnancy: Insufficient prenatal care (6 total prenatal visits with [facility]) Language barrier, Spanish speaking only, illiterate Preterm labor, had previously received magnesium and BMZ in December  Prenatal Procedures: NST  Consults: None  Significant Diagnostic Studies:  Results for orders placed or performed during the hospital encounter of 02/06/22 (from the past 168 hour(s))  Urinalysis, Complete w Microscopic -Urine, Clean Catch   Collection Time: 02/06/22  5:44 PM  Result Value Ref Range   Color, Urine YELLOW (A) YELLOW   APPearance HAZY (A) CLEAR   Specific Gravity, Urine 1.019 1.005 - 1.030   pH 6.0 5.0 - 8.0   Glucose, UA NEGATIVE NEGATIVE mg/dL   Hgb urine dipstick NEGATIVE NEGATIVE   Bilirubin Urine NEGATIVE NEGATIVE   Ketones, ur NEGATIVE NEGATIVE mg/dL   Protein, ur 30 (A) NEGATIVE mg/dL   Nitrite NEGATIVE NEGATIVE   Leukocytes,Ua TRACE (A) NEGATIVE   RBC / HPF 0-5 0 - 5 RBC/hpf   WBC, UA 6-10 0 - 5 WBC/hpf   Bacteria, UA NONE SEEN NONE SEEN   Squamous Epithelial / HPF 6-10 0 - 5 /HPF   Mucus PRESENT   CBC   Collection Time: 02/06/22  9:05 PM  Result Value Ref Range   WBC 10.3 4.0 - 10.5 K/uL   RBC 4.85 3.87 - 5.11 MIL/uL   Hemoglobin 11.7 (L) 12.0 - 15.0 g/dL   HCT 36.7 36.0 - 46.0 %   MCV 75.7 (L) 80.0 - 100.0 fL   MCH 24.1 (L) 26.0 - 34.0 pg   MCHC 31.9 30.0 - 36.0 g/dL   RDW 15.1 11.5 - 15.5 %   Platelets 353 150 - 400 K/uL   nRBC 0.0 0.0 - 0.2 %  RPR   Collection Time: 02/06/22  9:05 PM  Result Value Ref Range   RPR Ser Ql NON REACTIVE NON REACTIVE  Type and screen [place]   Collection Time: 02/06/22  9:05 PM  Result Value Ref Range   ABO/RH(D) O POS    Antibody Screen NEG    Sample Expiration      02/09/2022,2359 Performed at [facility], [address] [place], [place] [address]   Results for orders placed or performed in visit on 02/01/22 (from the past 168 hour(s))  Chlamydia/GC NAA, Confirmation   Collection Time: 02/01/22 11:07 AM   Specimen: Vaginal   VR     CD- [id number] MP  Result Value Ref Range   Chlamydia trachomatis, NAA Negative Negative   Neisseria gonorrhoeae, NAA Negative Negative  Culture, beta strep (group b only)   Collection Time: 02/01/22 11:07 AM   VR     CD- [id number] MP  Result Value Ref Range   Strep Gp B Culture Negative Negative    Treatments: IV hydration  Hospital Course:  This is a 22 y.o. G2P1001 with IUP at 110w6d admitted for active preterm labor, noted to have a cervical exam of 1/TH/-2.  No leaking of fluid and no bleeding.  After about 4 hours she changed to 3/50/ and again 6 hours later she changed to 4/70/-1.  After this exam was was able to sleep through the night and in the  morning UCs were less intense and further apart. Her SVE in the morning was 1/50/-3.  Discussed going home and reviewed precautions. She stated felt comfortable going home since she lived close and her mother would be with her.  Discharge Physical Exam:  BP 102/63 (BP Location: Right Arm)   Pulse 74   Temp 98.3 F (36.8 C) (Oral)   Resp 17   Ht 5\' 2"  (1.575 m)   Wt 74.4 kg   LMP 05/25/2021 (Exact Date)   BMI 30.00 kg/m   General: NAD CV: RRR Pulm: nl effort ABD: s/nd/nt, gravid DVT Evaluation: LE non-ttp, no evidence of DVT on exam.  NST: FHR baseline: 125 bpm Variability: moderate Accelerations: yes Decelerations: none Category/reactivity: reactive  TOCO: quiet SVE:  Dilation: 1 Effacement (%): 50 Cervical Position: Posterior Station: -3 Presentation: Vertex Exam by:: A Rodriguez  RN   Discharge Condition: Stable  Disposition: Discharge disposition: 01-Home or Self Care        Allergies as of 02/07/2022   No Known Allergies      Medication List     TAKE these medications    prenatal multivitamin Tabs tablet Take 1 tablet by mouth daily at 12 noon.        Follow-up Information     [facility]. Go on 02/08/2022.   Why: Keep all scheduled appointments Contact information: [place]                Signed:  Regina Eck 02/07/2022 2:34 PM

## 2022-02-10 ENCOUNTER — Telehealth: Payer: Self-pay

## 2022-02-10 ENCOUNTER — Ambulatory Visit: Payer: Self-pay

## 2022-02-10 NOTE — Telephone Encounter (Signed)
Patient called due to missed appointment today. Rescheduled for Monday 02/13/2022. BThiele RN

## 2022-02-13 ENCOUNTER — Observation Stay
Admission: EM | Admit: 2022-02-13 | Discharge: 2022-02-13 | Disposition: A | Discharge status: Home / Self Care | Payer: Self-pay | Attending: Obstetrics and Gynecology | Admitting: Obstetrics and Gynecology

## 2022-02-13 ENCOUNTER — Encounter: Payer: Self-pay | Admitting: Obstetrics and Gynecology

## 2022-02-13 ENCOUNTER — Ambulatory Visit: Payer: Self-pay | Admitting: Advanced Practice Midwife

## 2022-02-13 ENCOUNTER — Other Ambulatory Visit: Payer: Self-pay

## 2022-02-13 VITALS — BP 122/79 | HR 85 | Temp 97.6°F | Wt 172.4 lb

## 2022-02-13 DIAGNOSIS — Z3482 Encounter for supervision of other normal pregnancy, second trimester: Secondary | ICD-10-CM

## 2022-02-13 DIAGNOSIS — O0933 Supervision of pregnancy with insufficient antenatal care, third trimester: Secondary | ICD-10-CM

## 2022-02-13 DIAGNOSIS — Z3483 Encounter for supervision of other normal pregnancy, third trimester: Secondary | ICD-10-CM

## 2022-02-13 DIAGNOSIS — Z1152 Encounter for screening for COVID-19: Secondary | ICD-10-CM | POA: Insufficient documentation

## 2022-02-13 DIAGNOSIS — Z3490 Encounter for supervision of normal pregnancy, unspecified, unspecified trimester: Secondary | ICD-10-CM

## 2022-02-13 DIAGNOSIS — O368920 Maternal care for other specified fetal problems, second trimester, not applicable or unspecified: Principal | ICD-10-CM | POA: Insufficient documentation

## 2022-02-13 DIAGNOSIS — Z3A37 37 weeks gestation of pregnancy: Secondary | ICD-10-CM | POA: Insufficient documentation

## 2022-02-13 DIAGNOSIS — O0930 Supervision of pregnancy with insufficient antenatal care, unspecified trimester: Secondary | ICD-10-CM

## 2022-02-13 DIAGNOSIS — Z550 Illiteracy and low-level literacy: Secondary | ICD-10-CM

## 2022-02-13 LAB — RESP PANEL BY RT-PCR (RSV, FLU A&B, COVID)  RVPGX2
Influenza A by PCR: NEGATIVE
Influenza B by PCR: NEGATIVE
Resp Syncytial Virus by PCR: NEGATIVE
SARS Coronavirus 2 by RT PCR: NEGATIVE

## 2022-02-13 NOTE — Progress Notes (Signed)
Reviewed kick counts with client and 3 more cards given. Patient counseled to follow providers instructions to go to the hospital to be checked. Jenetta Downer, RN

## 2022-02-13 NOTE — OB Triage Note (Signed)
Pt is a 22yo G2P1, 37w 5d. She arrived to the unit with complaints of decreased fetal movement that started last night around 1920 when the contractions started. Pt reports contractions have consistently been every 2 minutes since 1920.   She denies vaginal bleeding, reports fetal movement upon arrival to unit. VS stable, monitors applied and assessing.   Initial FHT 130 at 1050.

## 2022-02-13 NOTE — Discharge Summary (Signed)
Patient ID: Donna Wolfe MRN: [id number] DOB/AGE: [birth year] 22 y.o.  Admit date: 02/13/2022 Discharge date: 02/13/2022  Admission Diagnoses: 22yo G2P1 at [redacted]w[redacted]d sent from the office for decreased fetal movement.   Discharge Diagnoses: RNST, no cervical change despite UCs showing on toco  Factors complicating pregnancy: Insufficient prenatal care (6 total prenatal visits with [facility]) Language barrier, Spanish speaking only, illiterate Preterm labor, had previously received magnesium and BMZ in December  Prenatal Procedures: NST  Consults: None  Significant Diagnostic Studies:  Results for orders placed or performed during the hospital encounter of 02/13/22 (from the past 24 hour(s))  Resp panel by RT-PCR (RSV, Flu A&B, Covid) Anterior Nasal Swab     Status: None   Collection Time: 02/13/22 12:12 PM   Specimen: Anterior Nasal Swab  Result Value Ref Range   SARS Coronavirus 2 by RT PCR NEGATIVE NEGATIVE   Influenza A by PCR NEGATIVE NEGATIVE   Influenza B by PCR NEGATIVE NEGATIVE   Resp Syncytial Virus by PCR NEGATIVE NEGATIVE     Treatments: oral hydration  Hospital Course:  This is a 22 y.o. G2P1001 with IUP at [redacted]w[redacted]d seen for decreased fetal movement and UCs noted on toco, low-grade fever, noted to have a cervical exam of 1/50/-3.  No leaking of fluid and no bleeding.  Respiratory panel negative, and a natural reduction in temperature.  She was observed, fetal heart rate monitoring remained reassuring, and she had no signs/symptoms of progressing labor or other maternal-fetal concerns.  Her cervical exam was unchanged from admission.  She was deemed stable for discharge to home with outpatient follow up.  Discharge Physical Exam:  BP 116/78   Pulse (!) 104   Temp 98.9 F (37.2 C) (Oral)   Resp 16   Ht 5\' 2"  (1.575 m)   Wt 75.8 kg   LMP 05/25/2021 (Exact Date)   BMI 30.54 kg/m   General: NAD CV: RRR Pulm: CTABL, nl effort ABD: s/nd/nt, gravid DVT Evaluation: LE  non-ttp, no evidence of DVT on exam.  NST: FHR baseline: 115 bpm Variability: moderate Accelerations: yes Decelerations: none Category/reactivity: reactive   TOCO: quiet SVE: deferred  Dilation: 1 Effacement (%): 50 Cervical Position: Posterior Station: -3 Presentation: Vertex Exam by:: A Franks RN   Discharge Condition: Stable  Disposition:  Discharge disposition: 01-Home or Self Care        Allergies as of 02/13/2022   No Known Allergies      Medication List     TAKE these medications    prenatal multivitamin Tabs tablet Take 1 tablet by mouth daily at 12 noon.        Follow-up Information     [facility] Follow up.   Why: Keep all scheduled appointments Contact information: [place]                Signed:  Regina Eck 02/13/2022 1:35 PM

## 2022-02-13 NOTE — Progress Notes (Signed)
[facility]  PRENATAL VISIT NOTE  Subjective:  Donna Wolfe is a 22 y.o. G2P1001 at [redacted]w[redacted]d being seen today for ongoing prenatal care.  She is currently monitored for the following issues for this low-risk pregnancy and has Late prenatal care 16 6/7; Prenatal care, subsequent pregnancy, second trimester; Noncompliant pregnant patient with no care x 9 wks; Illiterate; Preterm labor; Preterm contractions; and Uterine contractions on their problem list.  Patient reports  decreased fetal movement .  Contractions: Not present. Vag. Bleeding: None.  Movement: (!) Decreased. Denies leaking of fluid/ROM.   The following portions of the patient's history were reviewed and updated as appropriate: allergies, current medications, past family history, past medical history, past social history, past surgical history and problem list. Problem list updated.  Objective:   Vitals:   02/13/22 0822  BP: 122/79  Pulse: 85  Temp: 97.6 F (36.4 C)  Weight: 172 lb 6.4 oz (78.2 kg)    Fetal Status: Fetal Heart Rate (bpm): 120 Fundal Height: 37 cm Movement: (!) Decreased  Presentation: Vertex  General:  Alert, oriented and cooperative. Patient is in no acute distress.  Skin: Skin is warm and dry. No rash noted.   Cardiovascular: Normal heart rate noted  Respiratory: Normal respiratory effort, no problems with respiration noted  Abdomen: Soft, gravid, appropriate for gestational age.  Pain/Pressure: Absent     Pelvic: Cervical exam deferred        Extremities: Normal range of motion.  Edema: None  Mental Status: Normal mood and affect. Normal behavior. Normal judgment and thought content.   Assessment and Plan:  Pregnancy: G2P1001 at [redacted]w[redacted]d  1. Prenatal care, subsequent pregnancy, second trimester Not doing daily kick counts and states baby is not moving as much (5x yesterday and can't remember how many times day before); pt did not bring kick count  cards and states every time she goes to L&D they say "baby is fine". Stressed importance of doing daily kick counts and need to go to L&D if <10 movements/day. Counseled pt and FOB to go to L&D now for evaluation. Has had PTL x2 this pregnancy with last time with MgSo4 and BMTZ on 01/21/22 GC/Chlamydia/GBS cultures neg from 02/01/22 [facility] apt with [facility] and states rescheduled but can't remember the date Has car seat and crib Not working 22 lb 6.4 oz (10.2 kg) Knows when to go to L&D Chooses DMPA for pp birth control C/o cough since yesterday with no other symptoms BP sl elevated with 1 h/a last week relieved with Tylenol, denies scotoma, 122/79  2. Late prenatal care 16 6/7   3. Illiterate    Preterm labor symptoms and general obstetric precautions including but not limited to vaginal bleeding, contractions, leaking of fluid and fetal movement were reviewed in detail with the patient. Please refer to After Visit Summary for other counseling recommendations.  Return in about 1 week (around 02/20/2022) for routine PNC.  No future appointments.  Herbie Saxon, CNM

## 2022-02-13 NOTE — Progress Notes (Signed)
Vaginal bleeding and discharge, contractions, and fetal movement reviewed by RN.  Pt verbalizes understanding.  Follow-up care reviewed. Pt discharged home with significant other.

## 2022-02-13 NOTE — Progress Notes (Signed)
Patient here for MH RV at 37 5/7. [facility] pamphlet given, and patient counseled to come back with questions and choose BCM before birth of her baby. Jenetta Downer, RN

## 2022-02-17 ENCOUNTER — Encounter: Payer: Self-pay | Admitting: Obstetrics and Gynecology

## 2022-02-17 ENCOUNTER — Inpatient Hospital Stay
Admission: EM | Admit: 2022-02-17 | Discharge: 2022-02-19 | DRG: 806 | Disposition: A | Discharge status: Home / Self Care | Payer: Medicaid Other

## 2022-02-17 ENCOUNTER — Inpatient Hospital Stay: Payer: Medicaid Other | Admitting: Anesthesiology

## 2022-02-17 ENCOUNTER — Observation Stay: Payer: Medicaid Other

## 2022-02-17 ENCOUNTER — Other Ambulatory Visit: Payer: Self-pay

## 2022-02-17 DIAGNOSIS — O26893 Other specified pregnancy related conditions, third trimester: Secondary | ICD-10-CM | POA: Diagnosis present

## 2022-02-17 DIAGNOSIS — Z91199 Patient's noncompliance with other medical treatment and regimen due to unspecified reason: Secondary | ICD-10-CM

## 2022-02-17 DIAGNOSIS — D62 Acute posthemorrhagic anemia: Secondary | ICD-10-CM | POA: Diagnosis not present

## 2022-02-17 DIAGNOSIS — Z3A38 38 weeks gestation of pregnancy: Secondary | ICD-10-CM | POA: Diagnosis not present

## 2022-02-17 DIAGNOSIS — O9902 Anemia complicating childbirth: Secondary | ICD-10-CM | POA: Diagnosis present

## 2022-02-17 LAB — CBC
HCT: 37.7 % (ref 36.0–46.0)
Hemoglobin: 12 g/dL (ref 12.0–15.0)
MCH: 24.3 pg — ABNORMAL LOW (ref 26.0–34.0)
MCHC: 31.8 g/dL (ref 30.0–36.0)
MCV: 76.3 fL — ABNORMAL LOW (ref 80.0–100.0)
Platelets: 354 10*3/uL (ref 150–400)
RBC: 4.94 MIL/uL (ref 3.87–5.11)
RDW: 15.6 % — ABNORMAL HIGH (ref 11.5–15.5)
WBC: 10.3 10*3/uL (ref 4.0–10.5)
nRBC: 0 % (ref 0.0–0.2)

## 2022-02-17 LAB — TYPE AND SCREEN
ABO/RH(D): O POS
Antibody Screen: NEGATIVE

## 2022-02-17 LAB — RUPTURE OF MEMBRANE (ROM)PLUS: Rom Plus: NEGATIVE

## 2022-02-17 LAB — URINALYSIS, COMPLETE (UACMP) WITH MICROSCOPIC
Bilirubin Urine: NEGATIVE
Glucose, UA: NEGATIVE mg/dL
Hgb urine dipstick: NEGATIVE
Ketones, ur: NEGATIVE mg/dL
Nitrite: NEGATIVE
Protein, ur: 30 mg/dL — AB
Specific Gravity, Urine: 1.016 (ref 1.005–1.030)
pH: 6 (ref 5.0–8.0)

## 2022-02-17 MED ORDER — IBUPROFEN 600 MG PO TABS
600.0000 mg | ORAL_TABLET | Freq: Four times a day (QID) | ORAL | Status: DC
  Administered 2022-02-17 – 2022-02-19 (×6): 600 mg via ORAL
  Filled 2022-02-17 (×6): qty 1

## 2022-02-17 MED ORDER — OXYTOCIN BOLUS FROM INFUSION
333.0000 mL | Freq: Once | INTRAVENOUS | Status: AC
  Administered 2022-02-17: 333 mL via INTRAVENOUS

## 2022-02-17 MED ORDER — LACTATED RINGERS IV BOLUS
500.0000 mL | Freq: Once | INTRAVENOUS | Status: AC
  Administered 2022-02-17: 500 mL via INTRAVENOUS

## 2022-02-17 MED ORDER — SIMETHICONE 80 MG PO CHEW: 80.0000 mg | CHEWABLE_TABLET | ORAL | Status: DC | PRN

## 2022-02-17 MED ORDER — DIPHENHYDRAMINE HCL 50 MG/ML IJ SOLN: 12.5000 mg | INTRAMUSCULAR | Status: DC | PRN

## 2022-02-17 MED ORDER — TERBUTALINE SULFATE 1 MG/ML IJ SOLN: 0.2500 mg | Freq: Once | INTRAMUSCULAR | Status: DC | PRN

## 2022-02-17 MED ORDER — LIDOCAINE HCL (PF) 1 % IJ SOLN: 30.0000 mL | INTRAMUSCULAR | Status: DC | PRN

## 2022-02-17 MED ORDER — OXYTOCIN-SODIUM CHLORIDE 30-0.9 UT/500ML-% IV SOLN
2.5000 [IU]/h | INTRAVENOUS | Status: DC
  Filled 2022-02-17: qty 500

## 2022-02-17 MED ORDER — LACTATED RINGERS IV SOLN: INTRAVENOUS | Status: DC

## 2022-02-17 MED ORDER — EPHEDRINE 5 MG/ML INJ: 10.0000 mg | INTRAVENOUS | Status: DC | PRN

## 2022-02-17 MED ORDER — FERROUS SULFATE 325 (65 FE) MG PO TABS
325.0000 mg | ORAL_TABLET | Freq: Two times a day (BID) | ORAL | Status: DC
  Administered 2022-02-18 – 2022-02-19 (×3): 325 mg via ORAL
  Filled 2022-02-17 (×3): qty 1

## 2022-02-17 MED ORDER — ONDANSETRON HCL 4 MG PO TABS: 4.0000 mg | ORAL_TABLET | ORAL | Status: DC | PRN

## 2022-02-17 MED ORDER — LIDOCAINE HCL (PF) 1 % IJ SOLN
INTRAMUSCULAR | Status: AC
  Filled 2022-02-17: qty 30

## 2022-02-17 MED ORDER — OXYTOCIN 10 UNIT/ML IJ SOLN
INTRAMUSCULAR | Status: AC
  Filled 2022-02-17: qty 2

## 2022-02-17 MED ORDER — PHENYLEPHRINE 80 MCG/ML (10ML) SYRINGE FOR IV PUSH (FOR BLOOD PRESSURE SUPPORT): 80.0000 ug | PREFILLED_SYRINGE | INTRAVENOUS | Status: DC | PRN

## 2022-02-17 MED ORDER — LACTATED RINGERS IV SOLN
500.0000 mL | Freq: Once | INTRAVENOUS | Status: AC
  Administered 2022-02-17: 500 mL via INTRAVENOUS

## 2022-02-17 MED ORDER — ONDANSETRON HCL 4 MG/2ML IJ SOLN: 4.0000 mg | INTRAMUSCULAR | Status: DC | PRN

## 2022-02-17 MED ORDER — MISOPROSTOL 200 MCG PO TABS
ORAL_TABLET | ORAL | Status: AC
  Filled 2022-02-17: qty 4

## 2022-02-17 MED ORDER — SODIUM CHLORIDE 0.9 % IV SOLN: 250.0000 mL | INTRAVENOUS | Status: DC | PRN

## 2022-02-17 MED ORDER — BENZOCAINE-MENTHOL 20-0.5 % EX AERO
1.0000 | INHALATION_SPRAY | CUTANEOUS | Status: DC | PRN
  Filled 2022-02-17: qty 56

## 2022-02-17 MED ORDER — AMMONIA AROMATIC IN INHA
RESPIRATORY_TRACT | Status: AC
  Filled 2022-02-17: qty 10

## 2022-02-17 MED ORDER — ZOLPIDEM TARTRATE 5 MG PO TABS: 5.0000 mg | ORAL_TABLET | Freq: Every evening | ORAL | Status: DC | PRN

## 2022-02-17 MED ORDER — ACETAMINOPHEN 325 MG PO TABS: 650.0000 mg | ORAL_TABLET | ORAL | Status: DC | PRN

## 2022-02-17 MED ORDER — LACTATED RINGERS IV SOLN
500.0000 mL | INTRAVENOUS | Status: DC | PRN
  Administered 2022-02-17: 500 mL via INTRAVENOUS

## 2022-02-17 MED ORDER — OXYTOCIN-SODIUM CHLORIDE 30-0.9 UT/500ML-% IV SOLN
1.0000 m[IU]/min | INTRAVENOUS | Status: DC
  Administered 2022-02-17: 2 m[IU]/min via INTRAVENOUS

## 2022-02-17 MED ORDER — FENTANYL-BUPIVACAINE-NACL 0.5-0.125-0.9 MG/250ML-% EP SOLN
EPIDURAL | Status: AC
  Filled 2022-02-17: qty 250

## 2022-02-17 MED ORDER — FENTANYL CITRATE (PF) 100 MCG/2ML IJ SOLN: 50.0000 ug | INTRAMUSCULAR | Status: DC | PRN

## 2022-02-17 MED ORDER — WITCH HAZEL-GLYCERIN EX PADS
1.0000 | MEDICATED_PAD | CUTANEOUS | Status: DC | PRN
  Filled 2022-02-17: qty 100

## 2022-02-17 MED ORDER — SODIUM CHLORIDE 0.9% FLUSH: 3.0000 mL | INTRAVENOUS | Status: DC | PRN

## 2022-02-17 MED ORDER — FENTANYL-BUPIVACAINE-NACL 0.5-0.125-0.9 MG/250ML-% EP SOLN
12.0000 mL/h | EPIDURAL | Status: DC | PRN
  Administered 2022-02-17: 12 mL/h via EPIDURAL

## 2022-02-17 MED ORDER — SOD CITRATE-CITRIC ACID 500-334 MG/5ML PO SOLN: 30.0000 mL | ORAL | Status: DC | PRN

## 2022-02-17 MED ORDER — SODIUM CHLORIDE 0.9% FLUSH: 3.0000 mL | Freq: Two times a day (BID) | INTRAVENOUS | Status: DC

## 2022-02-17 MED ORDER — COCONUT OIL OIL: 1.0000 | TOPICAL_OIL | Status: DC | PRN

## 2022-02-17 MED ORDER — SENNOSIDES-DOCUSATE SODIUM 8.6-50 MG PO TABS
2.0000 | ORAL_TABLET | Freq: Every day | ORAL | Status: DC
  Administered 2022-02-18: 2 via ORAL
  Filled 2022-02-17: qty 2

## 2022-02-17 MED ORDER — BUPIVACAINE HCL (PF) 0.25 % IJ SOLN
INTRAMUSCULAR | Status: DC | PRN
  Administered 2022-02-17 (×2): 4 mL via EPIDURAL

## 2022-02-17 MED ORDER — ACETAMINOPHEN 500 MG PO TABS
1000.0000 mg | ORAL_TABLET | Freq: Four times a day (QID) | ORAL | Status: DC
  Administered 2022-02-17 – 2022-02-19 (×6): 1000 mg via ORAL
  Filled 2022-02-17 (×7): qty 2

## 2022-02-17 MED ORDER — LIDOCAINE HCL (PF) 1 % IJ SOLN
INTRAMUSCULAR | Status: DC | PRN
  Administered 2022-02-17: 3 mL via SUBCUTANEOUS

## 2022-02-17 MED ORDER — ONDANSETRON HCL 4 MG/2ML IJ SOLN: 4.0000 mg | Freq: Four times a day (QID) | INTRAMUSCULAR | Status: DC | PRN

## 2022-02-17 MED ORDER — DIBUCAINE (PERIANAL) 1 % EX OINT: 1.0000 | TOPICAL_OINTMENT | CUTANEOUS | Status: DC | PRN

## 2022-02-17 MED ORDER — DIPHENHYDRAMINE HCL 25 MG PO CAPS: 25.0000 mg | ORAL_CAPSULE | Freq: Four times a day (QID) | ORAL | Status: DC | PRN

## 2022-02-17 MED ORDER — LIDOCAINE-EPINEPHRINE (PF) 1.5 %-1:200000 IJ SOLN
INTRAMUSCULAR | Status: DC | PRN
  Administered 2022-02-17: 3 mL via EPIDURAL

## 2022-02-17 NOTE — Progress Notes (Signed)
L&D Note    Subjective:  Feeling more comfortable after epidural  Objective:   Vitals:   02/17/22 1825 02/17/22 1835 02/17/22 1850 02/17/22 1905  BP: 107/65 111/76 113/69 110/63  Pulse: 91 88 77 92  Resp:      Temp:      TempSrc:      SpO2: 99% 97% 96% 96%  Weight:      Height:        Current Vital Signs 24h Vital Sign Ranges  T 98.4 F (36.9 C) Temp  Avg: 98.4 F (36.9 C)  Min: 98.1 F (36.7 C)  Max: 98.6 F (37 C)  BP 110/63 BP  Min: 107/65  Max: 125/82  HR 92 Pulse  Avg: 86  Min: 77  Max: 92  RR 18 Resp  Avg: 18  Min: 18  Max: 18  SaO2 96 %   SpO2  Avg: 97.4 %  Min: 96 %  Max: 99 %      Gen: alert, cooperative, no distress FHR: Baseline: 125 bpm, Variability: moderate, Accels: Present, Decels: variable Toco: regular, every 2-3 minutes SVE: Dilation: 5.5 Effacement (%): 80 Cervical Position: Middle Station: -1, -2 Presentation: Vertex Exam by:: Ardelle Lesches, CNM  Medications SCHEDULED MEDICATIONS   ammonia       lidocaine (PF)       misoprostol       oxytocin       oxytocin 40 units in LR 1000 mL  333 mL Intravenous Once    MEDICATION INFUSIONS   fentaNYL 2 mcg/mL w/bupivacaine 0.125% in NS 250 mL 12 mL/hr (02/17/22 1822)   lactated ringers     lactated ringers 125 mL/hr at 02/17/22 1645   lactated ringers     oxytocin     oxytocin 16 milli-units/min (02/17/22 1815)    PRN MEDICATIONS  acetaminophen, ammonia, diphenhydrAMINE, ePHEDrine, ePHEDrine, fentaNYL (SUBLIMAZE) injection, fentaNYL 2 mcg/mL w/bupivacaine 0.125% in NS 250 mL, lactated ringers, lidocaine (PF), lidocaine (PF), misoprostol, ondansetron, oxytocin, phenylephrine, phenylephrine, sodium citrate-citric acid, terbutaline   Assessment & Plan:  22 y.o. G2P1001 at 86w2dadmitted for prodromal labor and Category II tracing  -Labor: Active phase labor. -Fetal Well-being: Category II - overall reassuring with moderate variability and accels  -GBS: negative -Membranes artificially ruptured for clear  fluid at 1Augustapresent management. -Analgesia: regional anesthesia   AMinda Meo CNM  02/17/2022 7:46 PM  [facility]

## 2022-02-17 NOTE — OB Triage Note (Signed)
Patient is a G2P1001 at 38wk2d coming in for ctx and abdominal pain. Patient reports yellow and brown discharge leaking since Monday (2/5). Patient reports ctx pain in her back, abdomen, and hips 10/10. Initial FHT 120bpm. Upon SVE, patient is 1.5/50/-3. Translator used via iPad for triage assessment. Will report to CNM on call.

## 2022-02-17 NOTE — Progress Notes (Signed)
L&D Note  Interpreter used for communication.  Subjective:  Contractions feel more intense   Objective:   Vitals:   02/17/22 0231 02/17/22 0605 02/17/22 0747 02/17/22 0755  BP: 125/78 112/73  125/82  Pulse: 80 90  92  Resp:    18  Temp:  98.6 F (37 C)  98.5 F (36.9 C)  TempSrc:  Oral  Oral  Weight:   80.3 kg   Height:   5' 2"$  (1.575 m)     Current Vital Signs 24h Vital Sign Ranges  T 98.5 F (36.9 C) Temp  Avg: 98.6 F (37 C)  Min: 98.5 F (36.9 C)  Max: 98.6 F (37 C)  BP 125/82 BP  Min: 112/73  Max: 125/82  HR 92 Pulse  Avg: 87.3  Min: 80  Max: 92  RR 18 Resp  Avg: 18  Min: 18  Max: 18  SaO2     No data recorded      Gen: alert, cooperative, no distress FHR: Baseline: 135 bpm, Variability: moderate, Accels: Present, Decels: variable - occasional Toco: regular, every 8-10 minutes SVE: Dilation: 3.5 Effacement (%): 80 Cervical Position: Posterior Station: -3 Exam by:: Ardelle Lesches, CNM  Medications SCHEDULED MEDICATIONS   oxytocin 40 units in LR 1000 mL  333 mL Intravenous Once    MEDICATION INFUSIONS   lactated ringers     lactated ringers 125 mL/hr at 02/17/22 0430   lactated ringers     oxytocin     oxytocin      PRN MEDICATIONS  acetaminophen, fentaNYL (SUBLIMAZE) injection, lactated ringers, lidocaine (PF), ondansetron, sodium citrate-citric acid, terbutaline   Assessment & Plan:  22 y.o. G2P1001 at 68w2dadmitted for prodromal labor and Cat II tracing  -Labor: Early latent labor.  Contractions are intensifying but not increasing in frequency.  Occasional variables noted but overall reassuring EFM with moderate variability and accels. Discussed augmentation with oxytocin versus continued expectant management.  Donna Wolfe desires augmentation with oxytocin.   -Fetal Well-being: Category II -> Category I  -GBS: negative -Membranes intact -Augmentation: IV Pitocin augmentation. -Analgesia: unmedicated labor support options . Desires Epidural when requested.     AMinda Meo CNM  02/17/2022 10:33 AM  [facility]

## 2022-02-17 NOTE — Anesthesia Procedure Notes (Signed)
Epidural Patient location during procedure: OB Start time: 02/17/2022 6:00 PM End time: 02/17/2022 6:25 PM  Staffing Anesthesiologist: Ilene Qua, MD Resident/CRNA: Mina Marble D, CRNA Performed: resident/CRNA   Preanesthetic Checklist Completed: patient identified, IV checked, risks and benefits discussed, surgical consent, monitors and equipment checked, pre-op evaluation and timeout performed  Epidural Patient position: sitting Prep: ChloraPrep Patient monitoring: heart rate, blood pressure and continuous pulse ox Approach: midline Location: L3-L4 Injection technique: LOR saline  Needle:  Needle type: Tuohy  Needle gauge: 17 G Needle length: 9 cm Needle insertion depth: 5 cm Catheter type: closed end flexible Catheter size: 19 Gauge Catheter at skin depth: 11 cm Test dose: 1.5% lidocaine with Epi 1:200 K  Assessment Sensory level: T6  Additional Notes Reason for block:procedure for pain

## 2022-02-17 NOTE — Discharge Summary (Signed)
Obstetrical Discharge Summary  Patient Name: Donna Wolfe DOB: 15-May-2000 MRN: [id number]  Date of Admission: 02/17/2022 Date of Delivery: 02/17/2022 Delivered by: Drinda Butts, CNM  Date of Discharge: 02/17/2022  Primary OB: [facility] [id number] last menstrual period was 05/25/2021 (exact date). EDC Estimated Date of Delivery: 03/01/22 Gestational Age at Delivery: [redacted]w[redacted]d  Antepartum complications:  Insufficient prenatal care s/p 6 visits at [facility] Language barrier, spanish speaking only, also illiterate Preterm labor, admitted with Magnesium and BMZ in December  Admitting Diagnosis: Labor and delivery, indication for care [O75.9]  Secondary Diagnosis: Patient Active Problem List   Diagnosis Date Noted   Labor and delivery, indication for care 02/17/2022   Pregnancy 02/13/2022   Preterm contractions 02/06/2022   Uterine contractions 02/06/2022   Preterm labor x2 with MgSo4 and BMTZ 01/21/22 12/29/2021   Noncompliant pregnant patient with no care x 9 wks 11/23/2021   Illiterate 11/23/2021   Late prenatal care 16 6/7 09/20/2021   Prenatal care, subsequent pregnancy, second trimester 09/20/2021    Discharge Diagnosis: Term Pregnancy Delivered      Augmentation: AROM and Pitocin Complications: None Intrapartum complications/course: Donna Wolfe presented to L&D with early labor.  She was admitted for intermittent category II tracing. She was initially expectantly managed.  Augmentation with oxytocin was initiated after contractions became less frequent.  AROM performed after epidural. She progressed well to C/C/+2 with an urge to push.  She pushed effectively over approximately 30 minutes for a spontaneous vaginal birth.  Delivery Type: spontaneous vaginal delivery Anesthesia: epidural anesthesia Placenta: spontaneous To Pathology: No  Laceration: small vaginal abrasions, hemostatic  Episiotomy: none Newborn Data: Live born female  Birth Weight:  *** APGAR: 9, 9  Newborn  Delivery   Birth date/time: 02/17/2022 21:20:00 Delivery type: Vaginal, Spontaneous      Postpartum Procedures: {postpartum procedures:3041411} Edinburgh:     10/29/2019    5:50 PM 09/08/2019   12:30 PM  Edinburgh Postnatal Depression Scale Screening Tool  I have been able to laugh and see the funny side of things. 0 1  I have looked forward with enjoyment to things. 0 1  I have blamed myself unnecessarily when things went wrong. 0 3  I have been anxious or worried for no good reason. 0 1  I have felt scared or panicky for no good reason. 0 0  Things have been getting on top of me. 0 3  I have been so unhappy that I have had difficulty sleeping. 0 0  I have felt sad or miserable. 0 0  I have been so unhappy that I have been crying. 0 0  The thought of harming myself has occurred to me. 0 0  Edinburgh Postnatal Depression Scale Total 0 9     Post partum course: *** Patient had an uncomplicated postpartum course.  By time of discharge on PPD#***, her pain was controlled on oral pain medications; she had appropriate lochia and was ambulating, voiding without difficulty and tolerating regular diet.  She was deemed stable for discharge to home.    Discharge Physical Exam: *** BP 121/82   Pulse 84   Temp 100.2 F (37.9 C) (Axillary)   Resp 18   Ht 5' 2"$  (1.575 m)   Wt 80.3 kg   LMP 05/25/2021 (Exact Date)   SpO2 97%   BMI 32.37 kg/m   General: NAD CV: RRR Pulm: CTABL, nl effort ABD: s/nd/nt, fundus firm and below the umbilicus Lochia: moderate Perineum: minimal edema/intact DVT Evaluation: LE non-ttp, no  evidence of DVT on exam.  Hemoglobin  Date Value Ref Range Status  02/17/2022 12.0 12.0 - 15.0 g/dL Final  09/20/2021 14.0 11.1 - 15.9 g/dL Final   HCT  Date Value Ref Range Status  02/17/2022 37.7 36.0 - 46.0 % Final   Hematocrit  Date Value Ref Range Status  09/20/2021 43.4 34.0 - 46.6 % Final    Risk assessment for postpartum VTE and prophylactic  treatment: Very high risk factors: None High risk factors: None Moderate risk factors: BMI 30-40 kg/m2  Postpartum VTE prophylaxis with LMWH not indicated  Disposition: stable, discharge to home. Baby Feeding: breast feeding Baby Disposition: home with mom  Rh Immune globulin indicated: No Rubella vaccine given: was not indicated Varivax vaccine given: was not indicated Flu vaccine given in AP setting: Yes  Tdap vaccine given in AP setting: Yes   Contraception: {PLAN CONTRACEPTION:313102}  Prenatal Labs:  Blood type/Rh O Pos  Antibody screen neg  Rubella Immune  Varicella  Hx Varivax x 2  RPR NR  HBsAg Neg  HIV NR  GC neg  Chlamydia neg  Genetic screening Negative MaterniT21 and AFP  1 hour GTT  116  3 hour GTT    GBS  Neg    Plan:  Donna Wolfe was discharged to home in good condition. Follow-up appointment with prenatal provider in 6 weeks.***  Discharge Medications: Allergies as of 02/17/2022   No Known Allergies   Med Rec must be completed prior to using this [facility]***        Follow-up Information     [facility] DEPT. Schedule an appointment as soon as possible for a visit in 6 week(s).   Why: postpartum visit Contact information: [address] [place] [id number] [phone]                Signed: *** Hit refresh and delete this line

## 2022-02-17 NOTE — Anesthesia Preprocedure Evaluation (Signed)
Anesthesia Evaluation  Patient identified by MRN, date of birth, ID band Patient awake    Reviewed: Allergy & Precautions, H&P , NPO status , Patient's Chart, lab work & pertinent test results  Airway Mallampati: II  TM Distance: >3 FB Neck ROM: full    Dental no notable dental hx.    Pulmonary neg pulmonary ROS   Pulmonary exam normal breath sounds clear to auscultation       Cardiovascular Exercise Tolerance: Good negative cardio ROS Normal cardiovascular exam Rhythm:regular Rate:Normal     Neuro/Psych negative neurological ROS  negative psych ROS   GI/Hepatic Neg liver ROS,GERD  ,,  Endo/Other  negative endocrine ROS    Renal/GU negative Renal ROS  negative genitourinary   Musculoskeletal   Abdominal   Peds  Hematology negative hematology ROS (+)   Anesthesia Other Findings   Reproductive/Obstetrics (+) Pregnancy                             Anesthesia Physical Anesthesia Plan  ASA: 2  Anesthesia Plan: Epidural   Post-op Pain Management:    Induction:   PONV Risk Score and Plan:   Airway Management Planned:   Additional Equipment:   Intra-op Plan:   Post-operative Plan:   Informed Consent: I have reviewed the patients History and Physical, chart, labs and discussed the procedure including the risks, benefits and alternatives for the proposed anesthesia with the patient or authorized representative who has indicated his/her understanding and acceptance.     Interpreter used for interview  Plan Discussed with: CRNA and Anesthesiologist  Anesthesia Plan Comments:        Anesthesia Quick Evaluation

## 2022-02-17 NOTE — Progress Notes (Addendum)
RN at bedside at 0308/0309 after noting drop in baseline FHT to 90bpm. Ultrasound adjusted to attempt to get consistent tracing of FHT. RN called for assistance from another unit RN. RN assisting patient in multiple position changes (left and right side-lying, semi-fowlers, and supine). CNM phoned from nursing station by other personnel [phone]. Earlean Polka, CNM at bedside at [phone]. FHT after returning closer to baseline at 0312 at 125bpm.

## 2022-02-17 NOTE — Progress Notes (Signed)
L&D Note    Subjective:  Contractions are feeling more intense and regular   Objective:   Vitals:   02/17/22 0747 02/17/22 0755 02/17/22 1215 02/17/22 1645  BP:  125/82 114/64 122/77  Pulse:  92 82 82  Resp:  18    Temp:  98.5 F (36.9 C) 98.1 F (36.7 C) 98.4 F (36.9 C)  TempSrc:  Oral Oral Oral  Weight: 80.3 kg     Height: 5' 2"$  (1.575 m)       Current Vital Signs 24h Vital Sign Ranges  T 98.4 F (36.9 C) Temp  Avg: 98.4 F (36.9 C)  Min: 98.1 F (36.7 C)  Max: 98.6 F (37 C)  BP 122/77 BP  Min: 112/73  Max: 125/82  HR 82 Pulse  Avg: 85.2  Min: 80  Max: 92  RR 18 Resp  Avg: 18  Min: 18  Max: 18  SaO2     No data recorded      Gen: alert, cooperative, no distress FHR: Baseline: 125 bpm, Variability: moderate, Accels: Present, Decels: none Toco: regular, every 2-3 minutes SVE: Dilation: 4.5 Effacement (%): 80 Cervical Position: Posterior Station: -2 Presentation: Vertex Exam by:: Dorlene Footman CNM  Medications SCHEDULED MEDICATIONS   oxytocin 40 units in LR 1000 mL  333 mL Intravenous Once    MEDICATION INFUSIONS   lactated ringers     lactated ringers 125 mL/hr at 02/17/22 1645   lactated ringers     oxytocin     oxytocin 14 milli-units/min (02/17/22 1520)    PRN MEDICATIONS  acetaminophen, fentaNYL (SUBLIMAZE) injection, lactated ringers, lidocaine (PF), ondansetron, sodium citrate-citric acid, terbutaline   Assessment & Plan:  22 y.o. G2P1001 at 9w2dadmitted for prodromal labor and Cat II tracing  -Labor: Adequate uterine activity - intensity and frequency. -Fetal Well-being: Category I -GBS: negative -Membranes intact -Continue present management.  Consider AROM after epidural  -Analgesia: regional anesthesia -Dr. BLeafy Roupdated on progress    AMinda Meo CNM  02/17/2022 5:47 PM  [facility]

## 2022-02-17 NOTE — H&P (Signed)
OB History & Physical   History of Present Illness:  Chief Complaint: irregular painful UCs.   HPI:  Donna Wolfe is a 22 y.o. G63P1001 female at 66w2ddated by [place] 145w2dEDD 03/01/22.  She presents to L&D for UCs and suspected LOF. Reports active FM and denies VB.     Pregnancy Issues: 1. Noncompliant with prenatal care, s/p 6 visits at [facility] 2. Language barrier, spanish speaking only, also illiterate 3. Preterm labor, admitted with Magnesium and BMZ in December   Maternal Medical History:   Past Medical History:  Diagnosis Date   Medical history non-contributory    Patient denies medical problems    Preterm labor 12/29/2021    Past Surgical History:  Procedure Laterality Date   denies     NO PAST SURGERIES      No Known Allergies  Prior to Admission medications   Medication Sig Start Date End Date Taking? Authorizing Provider  Prenatal Vit-Fe Fumarate-FA (PRENATAL MULTIVITAMIN) TABS tablet Take 1 tablet by mouth daily at 12 noon.    [provider]     [facility]   Social History: She  reports that she has never smoked. She has never been exposed to tobacco smoke. She has never used smokeless tobacco. She reports that she does not drink alcohol and does not use drugs.  Family History: family history includes Diabetes in her maternal grandmother; Healthy in her brother, brother, father, maternal grandfather, mother, paternal grandfather, paternal grandmother, sister, sister, and son.   Review of Systems: A full review of systems was performed and negative except as noted in the HPI.     Physical Exam:  Vital Signs: BP 112/73   Pulse 90   Temp 98.6 F (37 C) (Oral)   Ht 5' 2"$  (1.575 m)   Wt 75.8 kg   LMP 05/25/2021 (Exact Date)   BMI 30.54 kg/m   General: no acute distress.  HEENT: normocephalic, atraumatic Heart: regular rate & rhythm.  No murmurs/rubs/gallops Lungs: clear to auscultation  bilaterally, normal respiratory effort Abdomen: soft, gravid, non-tender;  EFW: 7lbs Pelvic:   External: Normal external female genitalia  Cervix: Dilation: 3 / Effacement (%): 80 / Station: -1    Extremities: non-tender, symmetric, No edema bilaterally.  DTRs: 2+  Neurologic: Alert & oriented x 3.    Results for orders placed or performed during the hospital encounter of 02/17/22 (from the past 24 hour(s))  ROM Plus ([facility] only)     Status: None   Collection Time: 02/17/22  3:30 AM  Result Value Ref Range   Rom Plus NEGATIVE   Urinalysis, Complete w Microscopic -Urine, Clean Catch     Status: Abnormal   Collection Time: 02/17/22  3:56 AM  Result Value Ref Range   Color, Urine YELLOW (A) YELLOW   APPearance HAZY (A) CLEAR   Specific Gravity, Urine 1.016 1.005 - 1.030   pH 6.0 5.0 - 8.0   Glucose, UA NEGATIVE NEGATIVE mg/dL   Hgb urine dipstick NEGATIVE NEGATIVE   Bilirubin Urine NEGATIVE NEGATIVE   Ketones, ur NEGATIVE NEGATIVE mg/dL   Protein, ur 30 (A) NEGATIVE mg/dL   Nitrite NEGATIVE NEGATIVE   Leukocytes,Ua SMALL (A) NEGATIVE   RBC / HPF 0-5 0 - 5 RBC/hpf   WBC, UA 11-20 0 - 5 WBC/hpf   Bacteria, UA RARE (A) NONE SEEN   Squamous Epithelial / HPF 6-10 0 - 5 /HPF   Mucus PRESENT  Pertinent Results:  Prenatal Labs: Blood type/Rh O Pos  Antibody screen neg  Rubella Immune  Varicella  Hx Varivax x 2  RPR NR  HBsAg Neg  HIV NR  GC neg  Chlamydia neg  Genetic screening Negative MaterniT21 and AFP  1 hour GTT  116  3 hour GTT   GBS  Neg   FHT: TOCO: SVE:  Dilation: 3 / Effacement (%): 80 / Station: -1    Cephalic by leopolds  US OB Limited  Result Date: 02/17/2022 CLINICAL DATA:  22 year old pregnant female with history of abnormal fetal heart rate. Amniotic fluid index requested. EXAM: LIMITED OBSTETRIC ULTRASOUND COMPARISON:  None Available. FINDINGS: Number of Fetuses: 1 Heart Rate:  132 bpm Movement: Present Presentation: Cephalic Placental Location:  Posterior Previa: None Amniotic Fluid (Subjective):  Within normal limits. AFI: 12.3 cm BPD: 9.1 cm 37 w  0 d MATERNAL FINDINGS: Cervix:  Appears closed. Uterus/Adnexae: No abnormality visualized. IMPRESSION: Single viable IUP with normal fetal heart rate and normal AFI, as above. This exam is performed on an emergent basis and does not comprehensively evaluate fetal size, dating, or anatomy; follow-up complete OB US should be considered if further fetal assessment is warranted. Electronically Signed   By: Vinnie Langton M.D.   On: 02/17/2022 06:14    Assessment:  Donna Wolfe is a 22 y.o. G9P1001 female at 18w2dwith early labor, FHR decel during triage eval.   Plan:  1. Admit to Labor & Delivery; consents reviewed and obtained - notified attending of admission.   2. Fetal Well being  - Fetal Tracing: Cat I now, will continue to monitor closely.  - Group B Streptococcus ppx indicated: Neg - Presentation: cephalic confirmed by exam   3. Routine OB: - Prenatal labs reviewed, as above - Rh O Pos - CBC, T&S, RPR on admit - Clear fluids, IVF  4. Monitoring of Labor -  Contractions: external toco in place -  Pelvis proven to 2930gm -  Plan for augment if needed.  -  Plan for continuous fetal monitoring  -  Maternal pain control as desired - Anticipate vaginal delivery  5. Post Partum Planning: - Infant feeding: breast and formula - Contraception: TBD - Tdap 12/07/21 - Flu 01/25/22  RRetsof CNM 02/17/22 6:46 AM

## 2022-02-17 NOTE — OB Triage Provider Note (Signed)
Donna Wolfe is a 22 y.o. female. She is at 30w2dgestation. Patient's last menstrual period was 05/25/2021 (exact date). Estimated Date of Delivery: 03/01/22  Prenatal care site: [facility]   Current pregnancy complicated by:  Insufficient prenatal care (6 total prenatal visits with [facility]) Language barrier, Spanish speaking only, illiterate Preterm labor, had previously received magnesium and BMZ in December  Chief complaint: ctx and abdominal pain. Patient reports yellow and brown discharge leaking since Monday (2/5)    S: Resting comfortably. no CTX, no VB.no LOF,  Active fetal movement. Denies: HA, visual changes, SOB, or RUQ/epigastric pain  Maternal Medical History:   Past Medical History:  Diagnosis Date   Medical history non-contributory    Patient denies medical problems    Preterm labor 12/29/2021    Past Surgical History:  Procedure Laterality Date   denies     NO PAST SURGERIES      No Known Allergies  Prior to Admission medications   Medication Sig Start Date End Date Taking? Authorizing Provider  Prenatal Vit-Fe Fumarate-FA (PRENATAL MULTIVITAMIN) TABS tablet Take 1 tablet by mouth daily at 12 noon.    [provider]      Social History: She  reports that she has never smoked. She has never been exposed to tobacco smoke. She has never used smokeless tobacco. She reports that she does not drink alcohol and does not use drugs.  Family History: family history includes Diabetes in her maternal grandmother; Healthy in her brother, brother, father, maternal grandfather, mother, paternal grandfather, paternal grandmother, sister, sister, and son.   Review of Systems: A full review of systems was performed and negative except as noted in the HPI.     O:  BP 125/78   Pulse 80   Ht 5' 2"$  (1.575 m)   Wt 75.8 kg   LMP 05/25/2021 (Exact Date)   BMI 30.54 kg/m  Results for orders placed or performed during the hospital encounter of 02/17/22 (from  the past 48 hour(s))  [facility] [place]   Collection Time: 02/17/22  3:30 AM  Result Value Ref Range   Rom Plus NEGATIVE      Constitutional: NAD, AAOx3  HE/ENT: extraocular movements grossly intact, moist mucous membranes CV: RRR PULM: nl respiratory effort, CTABL     Abd: gravid, non-tender, non-distended, soft      Ext: Non-tender, Nonedematous   Psych: mood appropriate, speech normal Pelvic: per nursing- no discharge noted with exam or ROM Plus swab.   Fetal  monitoring:  Baseline: 115bpm Variability: moderate Accelerations: present x >2 Decelerations: single deceleration noted, lasting about 522m, nadir to 90bpm, poorly and not continuously traced. Resolved with maternal position changes.   TOCO: q3-80m84m  A/P: 22 41o. 38w68w2de for antenatal surveillance for suspected ROM not Wolfe, false labor.    Labor: not present. No cervical change since last eval in triage on 02/13/22 Neg ROMplus.  Due to single prolonged decel will continuous monitor IV fluids given, pending UA.  Fetal Wellbeing: Reassuring Cat 1 tracing now.     Donna FoundM 02/17/2022  4:32 AM

## 2022-02-18 LAB — CBC
HCT: 32.3 % — ABNORMAL LOW (ref 36.0–46.0)
Hemoglobin: 10.3 g/dL — ABNORMAL LOW (ref 12.0–15.0)
MCH: 24.4 pg — ABNORMAL LOW (ref 26.0–34.0)
MCHC: 31.9 g/dL (ref 30.0–36.0)
MCV: 76.5 fL — ABNORMAL LOW (ref 80.0–100.0)
Platelets: 327 10*3/uL (ref 150–400)
RBC: 4.22 MIL/uL (ref 3.87–5.11)
RDW: 15.7 % — ABNORMAL HIGH (ref 11.5–15.5)
WBC: 11.3 10*3/uL — ABNORMAL HIGH (ref 4.0–10.5)
nRBC: 0 % (ref 0.0–0.2)

## 2022-02-18 LAB — RPR: RPR Ser Ql: NONREACTIVE

## 2022-02-18 MED ORDER — PRENATAL MULTIVITAMIN CH
1.0000 | ORAL_TABLET | Freq: Every day | ORAL | Status: DC
  Administered 2022-02-18: 1 via ORAL
  Filled 2022-02-18: qty 1

## 2022-02-18 MED ORDER — OXYCODONE HCL 5 MG PO TABS
5.0000 mg | ORAL_TABLET | ORAL | Status: DC | PRN
  Administered 2022-02-18: 5 mg via ORAL
  Filled 2022-02-18: qty 1

## 2022-02-18 MED ORDER — OXYCODONE HCL 5 MG PO TABS
10.0000 mg | ORAL_TABLET | ORAL | Status: DC | PRN
  Administered 2022-02-18 (×2): 10 mg via ORAL
  Filled 2022-02-18 (×3): qty 2

## 2022-02-18 NOTE — Lactation Note (Addendum)
This note was copied from a baby's chart. Lactation Consultation Note  Patient Name: Donna Wolfe [id number] Date: 02/18/2022 Reason for consult: Follow-up assessment;Early term 37-38.6wks Age:22 hours Mom's room visited x 2, Mom instructed at previous visit to call [facility] for next feeding so that I could observe a feeding, she was instructed to do this per video spanish interpreter, she voiced understanding Maternal Data Does the patient have breastfeeding experience prior to this delivery?: Yes How long did the patient breastfeed?: 1-2 days (states after baby had a heel stick would not breastfeed anymore)  Feeding Mother's Current Feeding Choice: Breast Milk and Formula BAby had fed 30 min earlier x 30 min, asleep now in bassinet LATCH Score Latch:  (no feeding observed, mother did not call [place] at last feeding to observe)                  Lactation Tools Discussed/Used    Interventions Interventions: Breast feeding basics reviewed;Education at visit with video [place] interpreter via [place]  Discharge Pump: Personal (states she tried to pump with this pump with first child and the pump didn't work, encouraged to have someone bring to hospital for [facility] to check) [place] Program: Yes  Consult Status Consult Status: Follow-up Date: 02/19/22 Follow-up type: In-patient    Ferol Luz 02/18/2022, 4:16 PM

## 2022-02-18 NOTE — Anesthesia Postprocedure Evaluation (Signed)
Anesthesia Post Note  Patient: Donna Wolfe  Procedure(s) Performed: AN AD HOC LABOR EPIDURAL  Patient location during evaluation: Mother Baby Anesthesia Type: Epidural Level of consciousness: oriented and awake and alert Pain management: pain level controlled Vital Signs Assessment: post-procedure vital signs reviewed and stable Respiratory status: spontaneous breathing and respiratory function stable Cardiovascular status: blood pressure returned to baseline and stable Postop Assessment: no headache, no backache, no apparent nausea or vomiting and able to ambulate Anesthetic complications: no   No notable events documented.   Last Vitals:  Vitals:   02/18/22 0405 02/18/22 0752  BP: 109/73 107/72  Pulse: 87 86  Resp: 18 18  Temp: 36.8 C 36.8 C  SpO2: 100% 100%    Last Pain:  Vitals:   02/18/22 0752  TempSrc: Oral  PainSc:                  Arita Miss

## 2022-02-18 NOTE — Progress Notes (Signed)
Post Partum Day 1  Subjective: Doing well, no concerns. Ambulating without difficulty, pain managed with PO meds, tolerating regular diet, and voiding without difficulty.   No fever/chills, chest pain, shortness of breath, nausea/vomiting, or leg pain. No nipple or breast pain. No headache, visual changes, or RUQ/epigastric pain.  Objective: BP 107/72 (BP Location: Left Arm)   Pulse 86   Temp 98.3 F (36.8 C) (Oral)   Resp 18   Ht 5' 2"$  (1.575 m)   Wt 80.3 kg   LMP 05/25/2021 (Exact Date)   SpO2 100%   BMI 32.37 kg/m    Physical Exam:  General: alert and cooperative Breasts: soft/nontender CV: RRR Pulm: nl effort Abdomen: soft, non-tender Uterine Fundus: firm Incision: n/a Perineum: minimal edema, intact Lochia: appropriate DVT Evaluation: No evidence of DVT seen on physical exam. Edinburgh:     10/29/2019    5:50 PM 09/08/2019   12:30 PM  Edinburgh Postnatal Depression Scale Screening Tool  I have been able to laugh and see the funny side of things. 0 1  I have looked forward with enjoyment to things. 0 1  I have blamed myself unnecessarily when things went wrong. 0 3  I have been anxious or worried for no good reason. 0 1  I have felt scared or panicky for no good reason. 0 0  Things have been getting on top of me. 0 3  I have been so unhappy that I have had difficulty sleeping. 0 0  I have felt sad or miserable. 0 0  I have been so unhappy that I have been crying. 0 0  The thought of harming myself has occurred to me. 0 0  Edinburgh Postnatal Depression Scale Total 0 9     Recent Labs    02/17/22 0744 02/18/22 0541  HGB 12.0 10.3*  HCT 37.7 32.3*  WBC 10.3 11.3*  PLT 354 327    Assessment/Plan: 22 y.o. G2P1001 postpartum day # 1  -Continue routine postpartum care -Lactation consult PRN for breastfeeding   -Acute blood loss anemia - hemodynamically stable and asymptomatic; start PO ferrous sulfate BID with stool softeners  -Immunization status:  all  immunizations up to date  Disposition: Continue inpatient postpartum care    LOS: 1 day   Jaleyah Longhi, CNM 02/18/2022, 9:43 AM

## 2022-02-19 MED ORDER — OXYCODONE HCL 5 MG PO TABS: 5.0000 mg | ORAL_TABLET | ORAL | 0 refills | Status: DC | PRN

## 2022-02-19 NOTE — Clinical Social Work Maternal (Signed)
  CLINICAL SOCIAL WORK MATERNAL/CHILD NOTE  Patient Details  Name: Donna Wolfe MRN: [id number] Date of Birth: [birth year]  Date:  02/19/2022  Clinical Social Worker Initiating Note:    Date/Time: Initiated:   2/ 11/24   Child's Name:    Wharton Parents:    Rocco Serene  Need for Interpreter:    Ferd Glassing  Reason for Referral:    late term prenatal care  Address:  [place] [address]    Phone number:  [phone] (home)     Additional phone number: none  Household Members/Support Persons (HM/SP):       HM/SP Name Relationship DOB or Age  HM/SP -1  Rocco Serene      HM/SP -2  MGM      HM/SP -3        HM/SP -4        HM/SP -5        HM/SP -6        HM/SP -7        HM/SP -8          Natural Supports (not living in the home):    FOB  Professional Supports:   none  Employment:   n/a  Type of Work:     Education:      Homebound arranged:  n/a  Museum/gallery curator Resources:    Medicaid potential, WIC potential  Other Resources:    none  Cultural/Religious Considerations Which May Impact Care:  none  Strengths:    Home prepared for baby  Psychotropic Medications:    n/a     Pediatrician:     [facility]  Pediatrician List:   [facility]      Pediatrician Fax Number:    Risk Factors/Current Problems:    late term care  Cognitive State:   normal   Mood/Affect:    normal  CSW Assessment: CSW spoke with mom concerning late term care, she states FOB worked two jobs during most of the pregnancy so she didn't have adequate transportation to get to her appts. She describes a lack of transportation available for her family. CSW asked if [name] felt transportation would be an issue moving forward, she stated it would not. CSW noticed during chart review that [name] is Medicaid Potential, CSW advised that in  the event it is approved that Medicaid also has a transportation benefit for both [name] and baby, mom thankful. Mom states she has a car seat and safe place for baby to sleep. Mom states she has her home prepared for her baby and a Pediatrician picked out but couldn't remember the name at the moment. No other questions voiced by [name] other than what time she and baby would be dc. No barriers to dc, RN made aware.    Loreta Ave, LCSWA 02/19/2022, 10:56 AM

## 2022-02-19 NOTE — Progress Notes (Signed)
Patient discharged. Discharge instructions given. Patient verbalizes understanding. Transported by staff.

## 2022-02-20 ENCOUNTER — Ambulatory Visit: Payer: Self-pay

## 2022-03-20 ENCOUNTER — Telehealth: Payer: Self-pay | Admitting: Family Medicine

## 2022-03-20 NOTE — Telephone Encounter (Signed)
Pt and emergency contact were called on Fri 3/8 to scheduled PP appt and left VM messages; called them again today Mon 3/11 and left VM for the 2nd time at the Pt's phone number and spoke with her mother.

## 2022-03-23 IMAGING — US US MFM OB COMP +14 WKS
1 series · 12 of 28 positions shown · non-contrast
Comparison: none

PATIENT INFO:

             HJ
PERFORMED BY:
                   Sonographer
SERVICE(S) PROVIDED:
 ----------------------------------------------------------------------
INDICATIONS:
  19 weeks gestation of pregnancy
FETAL EVALUATION:
 Num Of Fetuses:         1
 Fetal Heart Rate(bpm):  158
 Cardiac Activity:       Present
 Presentation:           Breech
 Placenta:               Anterior
                             Largest Pocket(cm)
BIOMETRY:
 BPD:      41.7  mm     G. Age:  18w 4d         23  %    CI:        69.18   %    70 - 86
                                                         FL/HC:      18.2   %    16.1 -
 HC:      160.1  mm     G. Age:  18w 6d         23  %    HC/AC:      1.18        1.09 -
 AC:      136.1  mm     G. Age:  19w 0d         38  %    FL/BPD:     70.0   %
 FL:       29.2  mm     G. Age:  19w 0d         33  %    FL/AC:      21.5   %    20 - 24
 HUM:      28.4  mm     G. Age:  19w 1d         48  %
 CER:      18.8  mm     G. Age:  18w 3d         25  %
 NFT:       4.3  mm
 CM:        2.9  mm
 Est. FW:     268  gm      0 lb 9 oz     32  %
GESTATIONAL AGE:
 LMP:           19w 2d        Date:  12/07/18                 EDD:   09/13/19
 U/S Today:     18w 6d                                        EDD:   09/16/19
 Best:          19w 2d     Det. By:  LMP  (12/07/18)          EDD:   09/13/19
ANATOMY:
 Cranium:               Normal appearance      Aortic Arch:            Normal appearance
 Cavum:                 CSP visualized         Ductal Arch:            Normal appearance
 Ventricles:            Normal appearance      Diaphragm:              Within Normal Limits
 Choroid Plexus:        Within Normal Limits   Stomach:                Seen
 Cerebellum:            Within Normal Limits   Abdomen:                Within Normal
                                                                       Limits
 Posterior Fossa:       Within Normal Limits   Abdominal Wall:         Normal appearance
 Nuchal Fold:           Within Normal Limits   Cord Vessels:           3 vessels
 Face:                  Orbits visualized      Kidneys:                Normal appearance
 Lips:                  Normal appearance      Bladder:                Seen
 Thoracic:              Within Normal Limits   Spine:                  Normal appearance
 Heart:                 4-Chamber view         Upper Extremities:      Visualized
                        appears normal
 RVOT:                  Normal appearance      Lower Extremities:      Visualized
 LVOT:                  Normal appearance
CERVIX UTERUS ADNEXA:
 Cervix
 Length:            3.1  cm.
 Adnexa
 WNL

[Series 1: us mfm ob comp +14 wks · 0.20mm/px · 12 of 89 slices shown]
[im 4/89]
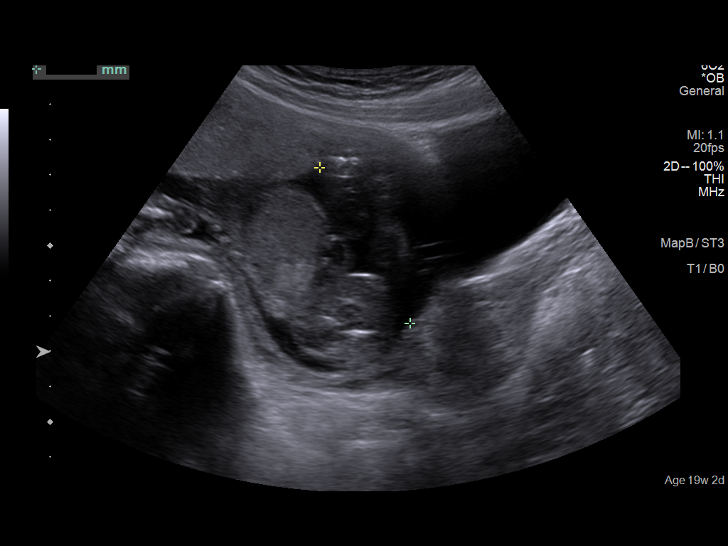
[im 10/89]
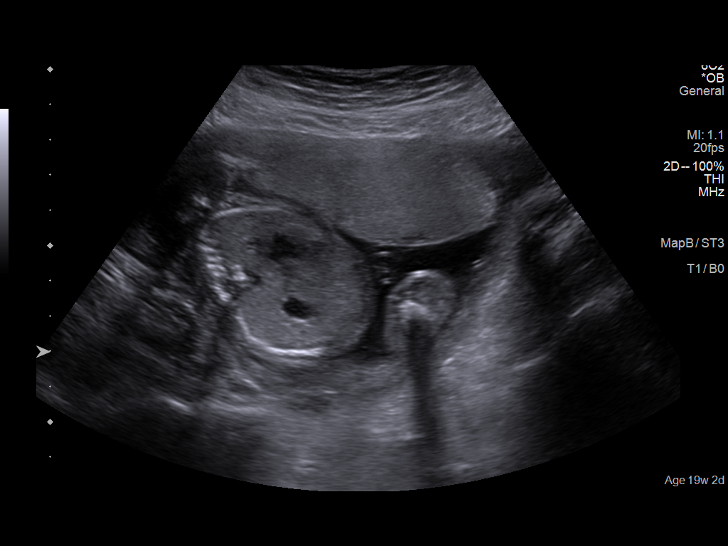
[im 17/89]
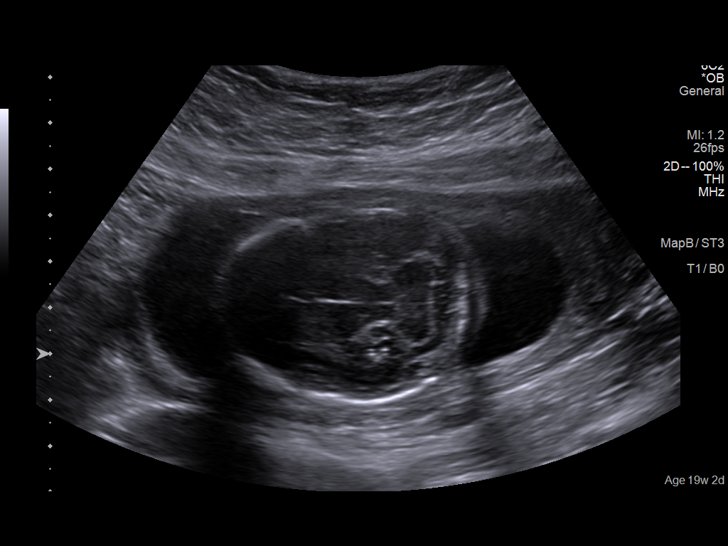
[im 27/89]
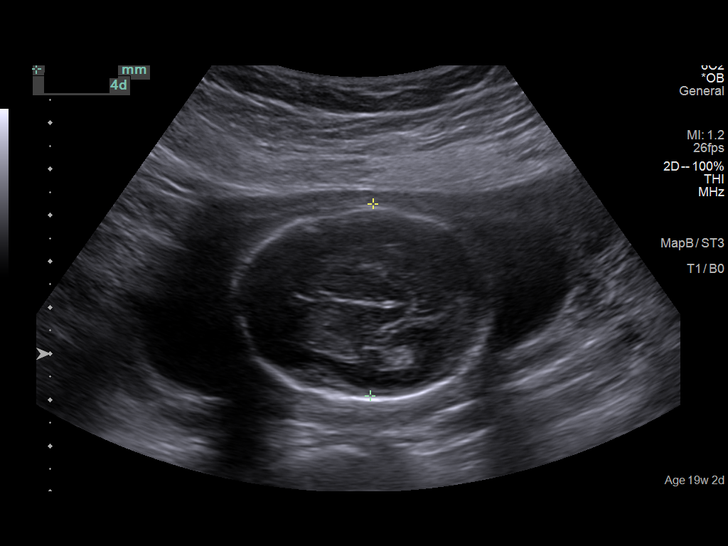
[im 33/89]
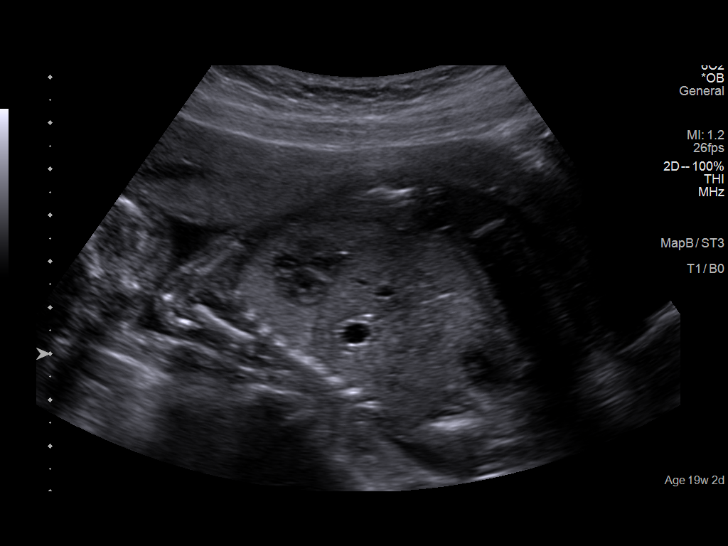
[im 40/89]
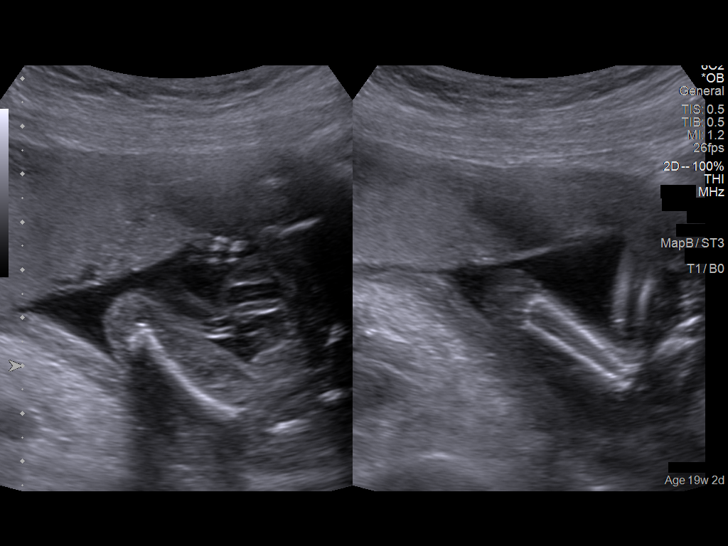
[im 49/89]
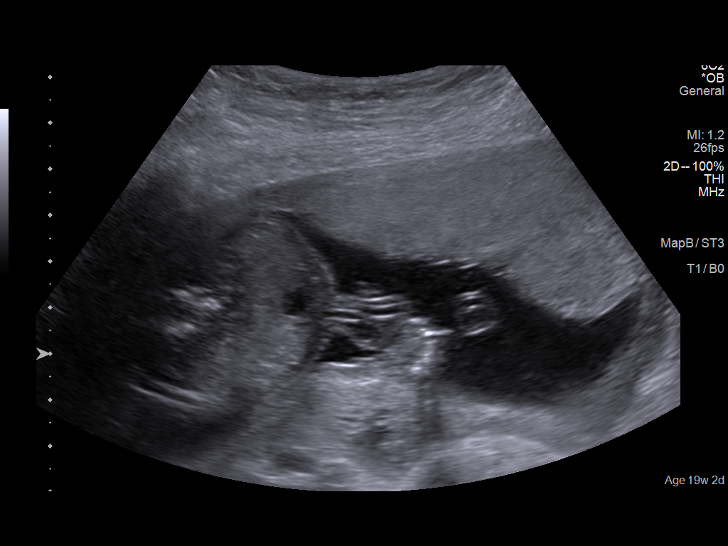
[im 56/89]
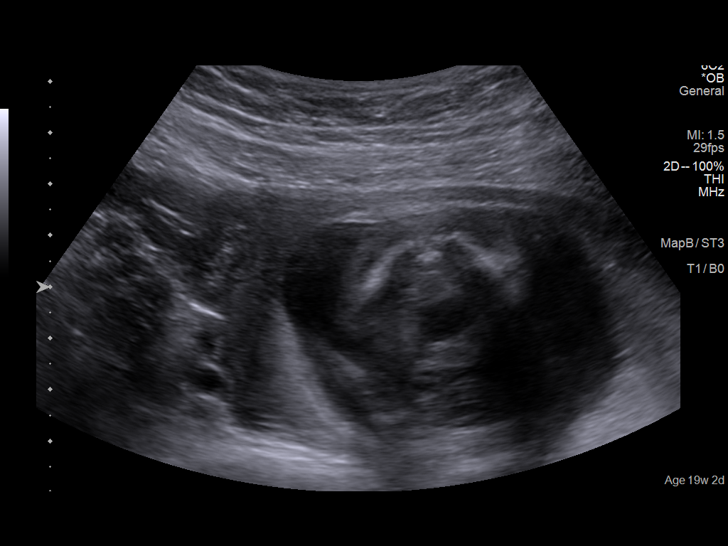
[im 62/89]
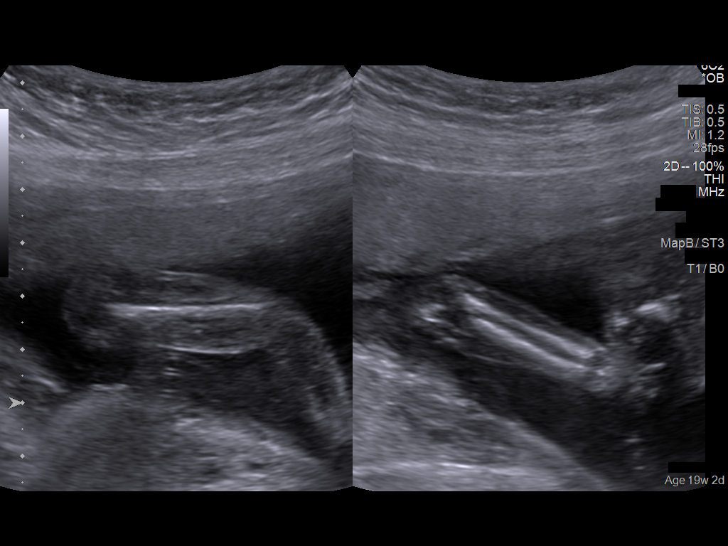
[im 72/89]
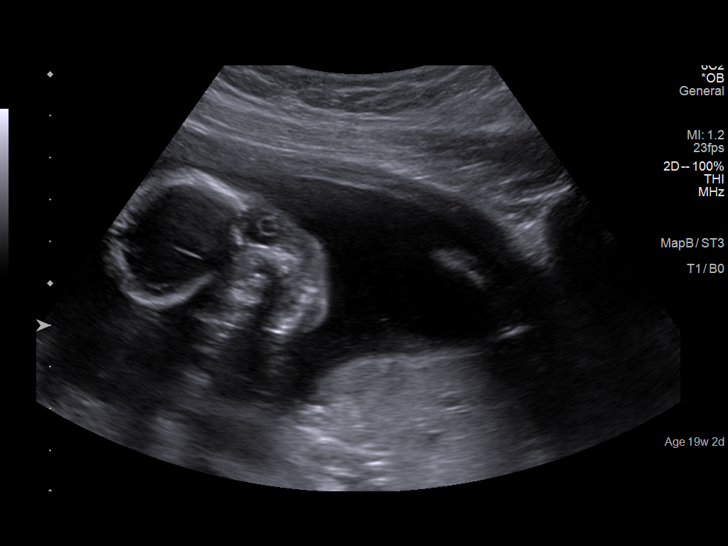
[im 79/89]
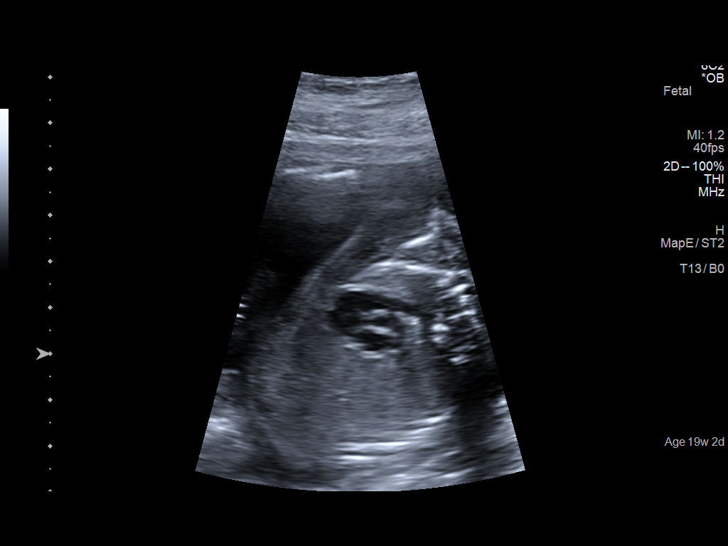
[im 85/89]
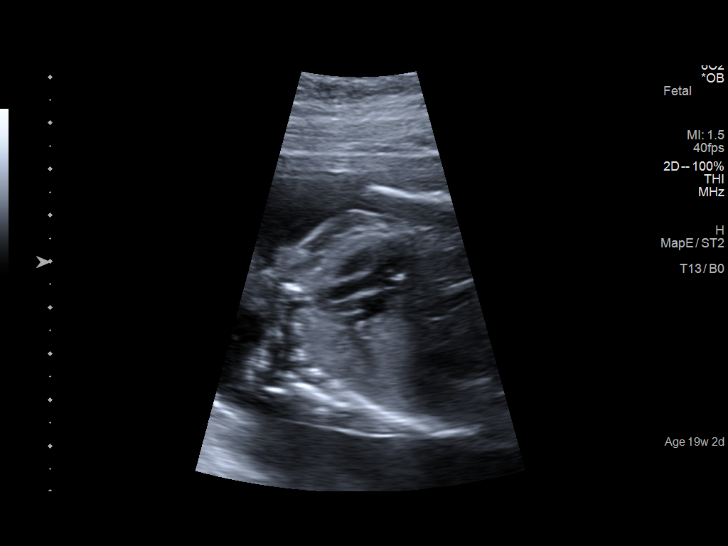

[12 of 28 positions shown; findings below may reference images not displayed]

IMPRESSION: Dear   STAPLES,

 Thank you for referring your patient  for a fetal anatomical
 survey.
 The ANSAARI is 19w 2 d based on her LMP of 12/07/18 - pt
 confirms the LMP is certain and regular.

 There is a singleton gestation with subjectively normal
 amniotic fluid volume.

 The fetal biometry correlates with established dating.

 Detailed evaluation of the fetal anatomy was performed.  The
 fetal anatomical survey appears within normal limits within
 the resolution of ultrasound as described above.
 Thank you for allowing us to participate in your patient's care.
 The results of her quad screen are pending .

 assistance.

## 2022-05-05 ENCOUNTER — Other Ambulatory Visit: Payer: Self-pay

## 2022-05-05 ENCOUNTER — Emergency Department: Payer: No Typology Code available for payment source

## 2022-05-05 ENCOUNTER — Emergency Department
Admission: EM | Admit: 2022-05-05 | Discharge: 2022-05-05 | Disposition: A | Discharge status: Home / Self Care | Payer: No Typology Code available for payment source | Attending: Emergency Medicine | Admitting: Emergency Medicine

## 2022-05-05 DIAGNOSIS — R1084 Generalized abdominal pain: Secondary | ICD-10-CM | POA: Diagnosis not present

## 2022-05-05 DIAGNOSIS — Y92410 Unspecified street and highway as the place of occurrence of the external cause: Secondary | ICD-10-CM | POA: Diagnosis not present

## 2022-05-05 DIAGNOSIS — S9032XA Contusion of left foot, initial encounter: Secondary | ICD-10-CM | POA: Insufficient documentation

## 2022-05-05 DIAGNOSIS — S99922A Unspecified injury of left foot, initial encounter: Secondary | ICD-10-CM | POA: Diagnosis present

## 2022-05-05 LAB — CBC WITH DIFFERENTIAL/PLATELET
Abs Immature Granulocytes: 0.08 10*3/uL — ABNORMAL HIGH (ref 0.00–0.07)
Basophils Absolute: 0.1 10*3/uL (ref 0.0–0.1)
Basophils Relative: 1 %
Eosinophils Absolute: 3.1 10*3/uL — ABNORMAL HIGH (ref 0.0–0.5)
Eosinophils Relative: 18 %
HCT: 44.4 % (ref 36.0–46.0)
Hemoglobin: 14 g/dL (ref 12.0–15.0)
Immature Granulocytes: 1 %
Lymphocytes Relative: 19 %
Lymphs Abs: 3.2 10*3/uL (ref 0.7–4.0)
MCH: 24.3 pg — ABNORMAL LOW (ref 26.0–34.0)
MCHC: 31.5 g/dL (ref 30.0–36.0)
MCV: 77.2 fL — ABNORMAL LOW (ref 80.0–100.0)
Monocytes Absolute: 0.9 10*3/uL (ref 0.1–1.0)
Monocytes Relative: 6 %
Neutro Abs: 9.4 10*3/uL — ABNORMAL HIGH (ref 1.7–7.7)
Neutrophils Relative %: 55 %
Platelets: 400 10*3/uL (ref 150–400)
RBC: 5.75 MIL/uL — ABNORMAL HIGH (ref 3.87–5.11)
RDW: 17.5 % — ABNORMAL HIGH (ref 11.5–15.5)
Smear Review: NORMAL
WBC: 16.8 10*3/uL — ABNORMAL HIGH (ref 4.0–10.5)
nRBC: 0 % (ref 0.0–0.2)

## 2022-05-05 LAB — URINALYSIS, ROUTINE W REFLEX MICROSCOPIC
Bilirubin Urine: NEGATIVE
Glucose, UA: NEGATIVE mg/dL
Hgb urine dipstick: NEGATIVE
Ketones, ur: NEGATIVE mg/dL
Nitrite: NEGATIVE
Protein, ur: NEGATIVE mg/dL
Specific Gravity, Urine: 1.012 (ref 1.005–1.030)
pH: 5 (ref 5.0–8.0)

## 2022-05-05 LAB — BASIC METABOLIC PANEL
Anion gap: 9 (ref 5–15)
BUN: 10 mg/dL (ref 6–20)
CO2: 22 mmol/L (ref 22–32)
Calcium: 8.8 mg/dL — ABNORMAL LOW (ref 8.9–10.3)
Chloride: 103 mmol/L (ref 98–111)
Creatinine, Ser: 0.43 mg/dL — ABNORMAL LOW (ref 0.44–1.00)
GFR, Estimated: >60 mL/min (ref >60)
Glucose, Bld: 89 mg/dL (ref 70–99)
Potassium: 3.7 mmol/L (ref 3.5–5.1)
Sodium: 134 mmol/L — ABNORMAL LOW (ref 135–145)

## 2022-05-05 LAB — POC URINE PREG, ED: Preg Test, Ur: NEGATIVE

## 2022-05-05 LAB — HCG, QUANTITATIVE, PREGNANCY: hCG, Beta Chain, Quant, S: <1 m[IU]/mL (ref <5)

## 2022-05-05 MED ORDER — IOHEXOL 300 MG/ML  SOLN
100.0000 mL | Freq: Once | INTRAMUSCULAR | Status: AC | PRN
  Administered 2022-05-05: 100 mL via INTRAVENOUS

## 2022-05-05 MED ORDER — ACETAMINOPHEN 500 MG PO TABS
1000.0000 mg | ORAL_TABLET | Freq: Once | ORAL | Status: AC
  Administered 2022-05-05: 1000 mg via ORAL
  Filled 2022-05-05: qty 2
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
## 2022-05-25 ENCOUNTER — Telehealth: Payer: Self-pay | Admitting: Family Medicine

## 2022-05-25 NOTE — Telephone Encounter (Signed)
Pt did not have her PP appointment, and has a PE appointment for the 6/6. She called because she has a bump on her vagina that is growing and getting more painful. She wants to be called by someone from the clinic to see if we can work her in sooner, or how to proceed. Please call with a spanish interpreter.

## 2022-05-25 NOTE — Telephone Encounter (Signed)
Pt did not have her PP appointment, and has a PE appointment for the 6/6. She called because she has a bump on her vagina that is growing and getting more painful. She wants to be called by someone from the clinic to see if we can work her in sooner, or how to proceed. Please call with a spanish interpreter.    

## 2022-05-25 NOTE — Telephone Encounter (Signed)
Unable to leave a message - VM is not set up.

## 2022-05-26 NOTE — Telephone Encounter (Signed)
In the process of calling client with [facility] [employer]pre[employer] ID # [phone] to notify her of availability of work in appt (as per Sebastian Ache RN note) on 05/29/22 at 1300. Prior to [employer] client, RN realized client had 05/29/22 appt scheduled. Call discontinued. Jossie Ng, RN

## 2022-05-29 ENCOUNTER — Ambulatory Visit: Payer: Self-pay | Admitting: Family Medicine

## 2022-05-29 DIAGNOSIS — Z113 Encounter for screening for infections with a predominantly sexual mode of transmission: Secondary | ICD-10-CM

## 2022-05-29 LAB — HM HIV SCREENING LAB: HM HIV Screening: NEGATIVE

## 2022-05-29 NOTE — Progress Notes (Signed)
[facility] Department  STI clinic/screening visit [address] [place] [address] [phone]  Subjective:  Donna Wolfe is a 22 y.o. female being seen today for an STI screening visit. The patient reports they do have symptoms.  Patient reports that they do not desire a pregnancy in the next year.   They reported they are not interested in discussing contraception today.    Patient's last menstrual period was 05/16/2022 (exact date).  Patient has the following medical conditions:   Patient Active Problem List   Diagnosis Date Noted   Illiterate 11/23/2021    Chief Complaint  Patient presents with   SEXUALLY TRANSMITTED DISEASE    Lesion on vagina per pt, burning with urination     HPI  Patient reports to clinic for STI Testing. States she had a lesion on her vagina and was having burning with urination.   Does the patient using douching products? No  Last HIV test per patient/review of record was No results found for: "HMHIVSCREEN"  Lab Results  Component Value Date   HIV Non Reactive 09/20/2021   Patient reports last pap was No results found for: "DIAGPAP" No results found for: "SPECADGYN"  Screening for MPX risk: Does the patient have an unexplained rash? No Is the patient MSM? No Does the patient endorse multiple sex partners or anonymous sex partners? No Did the patient have close or sexual contact with a person diagnosed with MPX? No Has the patient traveled outside the [place] where MPX is endemic? No Is there a high clinical suspicion for MPX-- evidenced by one of the following No  -Unlikely to be chickenpox  -Lymphadenopathy  -Rash that present in same phase of evolution on any given body part See flowsheet for further details and programmatic requirements.   Immunization history:  Immunization History  Administered Date(s) Administered   Influenza,inj,Quad PF,6+ Mos 01/25/2022   Tdap 06/26/2019, 12/07/2021     The  following portions of the patient's history were reviewed and updated as appropriate: allergies, current medications, past medical history, past social history, past surgical history and problem list.  Objective:  There were no vitals filed for this visit.  Physical Exam Vitals and nursing note reviewed.  Constitutional:      Appearance: Normal appearance.  HENT:     Head: Normocephalic and atraumatic.     Mouth/Throat:     Mouth: Mucous membranes are moist.     Pharynx: Oropharynx is clear. No oropharyngeal exudate or posterior oropharyngeal erythema.  Pulmonary:     Effort: Pulmonary effort is normal.  Abdominal:     General: Abdomen is flat.     Palpations: There is no mass.     Tenderness: There is no abdominal tenderness. There is no rebound.  Genitourinary:    General: Normal vulva.     Exam position: Lithotomy position.     Pubic Area: No rash or pubic lice.      Labia:        Right: No rash or lesion.        Left: No rash or lesion.      Vagina: Vaginal discharge present. No erythema, bleeding or lesions.     Cervix: No cervical motion tenderness, discharge, friability, lesion or erythema.     Uterus: Normal.      Adnexa: Right adnexa normal and left adnexa normal.     Rectum: Normal.     Comments: pH = 4  White frothy discharge  Lymphadenopathy:  Head:     Right side of head: No preauricular or posterior auricular adenopathy.     Left side of head: No preauricular or posterior auricular adenopathy.     Cervical: No cervical adenopathy.     Upper Body:     Right upper body: No supraclavicular, axillary or epitrochlear adenopathy.     Left upper body: No supraclavicular, axillary or epitrochlear adenopathy.     Lower Body: No right inguinal adenopathy. No left inguinal adenopathy.  Skin:    General: Skin is warm and dry.     Findings: No rash.  Neurological:     Mental Status: She is alert and oriented to person, place, and time.      Assessment and  Plan:  Donna Wolfe is a 22 y.o. female presenting to the [facility] Department for STI screening  1. Screening for venereal disease  - HIV [place] [facility] - Syphilis Serology, [place] [facility] - Chlamydia/Gonorrhea  Lab - WET PREP FOR TRICH, YEAST, CLUE   Patient accepted all screenings including vaginal CT/GC and bloodwork for HIV/RPR, and wet prep. Patient meets criteria for HepB screening? No. Ordered? not applicable Patient meets criteria for HepC screening? No. Ordered? not applicable  Treat wet prep per standing order Discussed time line for [facility] results and that patient will be called with positive results and encouraged patient to call if she had not heard in 2 weeks.  Counseled to return or seek care for continued or worsening symptoms Recommended repeat testing in 3 months with positive results. Recommended condom use with all sex  Patient is currently using  nothing  to prevent pregnancy.    No follow-ups on file.  Future Appointments  Date Time Provider Department Center  06/15/2022  4:00 PM [facility] PROVIDER [facility] None  Due to language barrier, interpreter Estill Dooms was present for this visit.   Donna Wolfe, [place]

## 2022-05-29 NOTE — Progress Notes (Signed)
Pt is here for STD screening.  Wet mount results reviewed, no treatment required.   Condoms declined.  Javae Braaten M Brynnly Bonet, RN  

## 2022-05-30 LAB — WET PREP FOR TRICH, YEAST, CLUE
Trichomonas Exam: NEGATIVE
Yeast Exam: NEGATIVE

## 2022-06-15 ENCOUNTER — Ambulatory Visit: Payer: Self-pay

## 2022-07-20 ENCOUNTER — Emergency Department
Admission: EM | Admit: 2022-07-20 | Discharge: 2022-07-20 | Disposition: A | Discharge status: Home / Self Care | Payer: Self-pay | Attending: Emergency Medicine | Admitting: Emergency Medicine

## 2022-07-20 ENCOUNTER — Other Ambulatory Visit: Payer: Self-pay

## 2022-07-20 ENCOUNTER — Encounter: Payer: Self-pay | Admitting: Emergency Medicine

## 2022-07-20 DIAGNOSIS — R110 Nausea: Secondary | ICD-10-CM | POA: Insufficient documentation

## 2022-07-20 DIAGNOSIS — R1030 Lower abdominal pain, unspecified: Secondary | ICD-10-CM | POA: Insufficient documentation

## 2022-07-20 LAB — COMPREHENSIVE METABOLIC PANEL
ALT: 43 U/L (ref 0–44)
AST: 51 U/L — ABNORMAL HIGH (ref 15–41)
Albumin: 4.3 g/dL (ref 3.5–5.0)
Alkaline Phosphatase: 116 U/L (ref 38–126)
Anion gap: 10 (ref 5–15)
BUN: 13 mg/dL (ref 6–20)
CO2: 22 mmol/L (ref 22–32)
Calcium: 9.4 mg/dL (ref 8.9–10.3)
Chloride: 106 mmol/L (ref 98–111)
Creatinine, Ser: 0.53 mg/dL (ref 0.44–1.00)
GFR, Estimated: >60 mL/min (ref >60)
Glucose, Bld: 96 mg/dL (ref 70–99)
Potassium: 3.7 mmol/L (ref 3.5–5.1)
Sodium: 138 mmol/L (ref 135–145)
Total Bilirubin: 0.7 mg/dL (ref 0.3–1.2)
Total Protein: 8.1 g/dL (ref 6.5–8.1)

## 2022-07-20 LAB — CBC
HCT: 45 % (ref 36.0–46.0)
Hemoglobin: 14.6 g/dL (ref 12.0–15.0)
MCH: 25.3 pg — ABNORMAL LOW (ref 26.0–34.0)
MCHC: 32.4 g/dL (ref 30.0–36.0)
MCV: 78.1 fL — ABNORMAL LOW (ref 80.0–100.0)
Platelets: 422 10*3/uL — ABNORMAL HIGH (ref 150–400)
RBC: 5.76 MIL/uL — ABNORMAL HIGH (ref 3.87–5.11)
RDW: 14.3 % (ref 11.5–15.5)
WBC: 10.2 10*3/uL (ref 4.0–10.5)
nRBC: 0 % (ref 0.0–0.2)

## 2022-07-20 LAB — LIPASE, BLOOD: Lipase: 28 U/L (ref 11–51)

## 2022-07-20 MED ORDER — CEFDINIR 300 MG PO CAPS
300.0000 mg | ORAL_CAPSULE | Freq: Once | ORAL | Status: DC
  Filled 2022-07-20: qty 1

## 2022-07-20 MED ORDER — CEFDINIR 300 MG PO CAPS: 300.0000 mg | ORAL_CAPSULE | Freq: Two times a day (BID) | ORAL | 0 refills | Status: AC

## 2022-07-20 NOTE — ED Provider Notes (Signed)
[facility] Provider Note   Event Date/Time   First MD Initiated Contact with Patient 07/20/22 1932     (approximate) History  Abdominal Pain  HPI Donna Wolfe is a 22 y.o. female with a stated past medical history of ovarian cysts who presents for lower abdominal pain that has been intermittent for the past month.  Patient states that it is associated with mild nausea.  Patient states that his pain occurs approximately every hour and resolved spontaneously within a few minutes.  Patient denies any other exacerbating or relieving factors for this pain. ROS: Patient currently denies any vision changes, tinnitus, difficulty speaking, facial droop, sore throat, chest pain, shortness of breath, vomiting/diarrhea, dysuria, abnormal vaginal discharge, or weakness/numbness/paresthesias in any extremity   Physical Exam  Triage Vital Signs: ED Triage Vitals [07/20/22 1831]  Encounter Vitals Group     BP 113/76     Systolic BP Percentile      Diastolic BP Percentile      Pulse Rate (!) 104     Resp 18     Temp 98.2 F (36.8 C)     Temp Source Oral     SpO2 98 %     Weight      Height      Head Circumference      Peak Flow      Pain Score 10     Pain Loc      Pain Education      Exclude from Growth Chart    Most recent vital signs: Vitals:   07/20/22 1831  BP: 113/76  Pulse: (!) 104  Resp: 18  Temp: 98.2 F (36.8 C)  SpO2: 98%   General: Awake, oriented x4. CV:  Good peripheral perfusion.  Resp:  Normal effort.  Abd:  No distention.  Other:  Young adult overweight Hispanic female laying in bed in no acute distress ED Results / Procedures / Treatments  Labs (all labs ordered are listed, but only abnormal results are displayed) Labs Reviewed  COMPREHENSIVE METABOLIC PANEL - Abnormal; Notable for the following components:      Result Value   AST 51 (*)    All other components within normal limits  CBC - Abnormal; Notable for the  following components:   RBC 5.76 (*)    MCV 78.1 (*)    MCH 25.3 (*)    Platelets 422 (*)    All other components within normal limits  LIPASE, BLOOD  URINALYSIS, ROUTINE W REFLEX MICROSCOPIC  POC URINE PREG, ED  PROCEDURES: Critical Care performed: No .1-3 Lead EKG Interpretation  Performed by: Merwyn Katos, MD Authorized by: Merwyn Katos, MD     Interpretation: normal     ECG rate:  71   ECG rate assessment: normal     Rhythm: sinus rhythm     Ectopy: none     Conduction: normal    MEDICATIONS ORDERED IN ED: Medications  cefdinir (OMNICEF) capsule 300 mg (has no administration in time range)   IMPRESSION / MDM / ASSESSMENT AND PLAN / ED COURSE  I reviewed the triage vital signs and the nursing notes.                             The patient is on the cardiac monitor to evaluate for evidence of arrhythmia and/or significant heart rate changes. Patient's presentation is most consistent with acute presentation with potential threat  to life or bodily function. Patients symptoms not typical for emergent causes of abdominal pain such as, but not limited to, appendicitis, abdominal aortic aneurysm, surgical biliary disease, pancreatitis, SBO, mesenteric ischemia, serious intra-abdominal bacterial illness. Presentation also not typical of gynecologic emergencies such as TOA, Ovarian Torsion, PID. Not Ectopic. Doubt atypical ACS.  Pt tolerating PO. Disposition: Patient will be discharged with strict return precautions and follow up with primary MD within 12-24 hours for further evaluation. Patient understands that this still may have an early presentation of an emergent medical condition such as appendicitis that will require a recheck.   FINAL CLINICAL IMPRESSION(S) / ED DIAGNOSES   Final diagnoses:  Lower abdominal pain   Rx / DC Orders   ED Discharge Orders          Ordered    cefdinir (OMNICEF) 300 MG capsule  2 times daily        07/20/22 1949            Note:  This document was prepared using Dragon voice recognition software and may include unintentional dictation errors.   Merwyn Katos, MD 07/20/22 [phone]

## 2022-07-20 NOTE — ED Triage Notes (Signed)
Patient to ED via POV for lower abd pain x1 month. Pain worse today. Also having nausea- denies vomiting or diarrhea.

## 2023-07-10 ENCOUNTER — Other Ambulatory Visit: Payer: Self-pay

## 2023-07-10 ENCOUNTER — Emergency Department
Admission: EM | Admit: 2023-07-10 | Discharge: 2023-07-10 | Disposition: A | Discharge status: Home / Self Care | Payer: Worker's Compensation

## 2023-07-10 ENCOUNTER — Emergency Department: Payer: Self-pay

## 2023-07-10 ENCOUNTER — Emergency Department: Payer: Worker's Compensation

## 2023-07-10 DIAGNOSIS — W010XXA Fall on same level from slipping, tripping and stumbling without subsequent striking against object, initial encounter: Secondary | ICD-10-CM | POA: Diagnosis not present

## 2023-07-10 DIAGNOSIS — Y990 Civilian activity done for income or pay: Secondary | ICD-10-CM | POA: Diagnosis not present

## 2023-07-10 DIAGNOSIS — S93601A Unspecified sprain of right foot, initial encounter: Secondary | ICD-10-CM | POA: Diagnosis not present

## 2023-07-10 DIAGNOSIS — S93401A Sprain of unspecified ligament of right ankle, initial encounter: Secondary | ICD-10-CM | POA: Diagnosis not present

## 2023-07-10 DIAGNOSIS — M79671 Pain in right foot: Secondary | ICD-10-CM | POA: Diagnosis present

## 2023-07-10 MED ORDER — IBUPROFEN 800 MG PO TABS: 800.0000 mg | ORAL_TABLET | Freq: Three times a day (TID) | ORAL | 0 refills | Status: DC | PRN

## 2023-07-10 MED ORDER — IBUPROFEN 800 MG PO TABS
800.0000 mg | ORAL_TABLET | Freq: Once | ORAL | Status: AC
  Administered 2023-07-10: 800 mg via ORAL
  Filled 2023-07-10: qty 1

## 2023-07-10 NOTE — ED Provider Notes (Signed)
 [facility] Emergency Department Provider Note     Event Date/Time   First MD Initiated Contact with Patient 07/10/23 2002     (approximate)   History   Foot Pain   HPI  History limited by Spanish language.  Tele-interpreter [id number]) used for HPI and exam.  Donna Wolfe is a 23 y.o. female with a noncontributory medical history, presents to the ED endorsing right foot pain.  Patient reports that about 3 weeks ago she had a slip and fall injury at work.  She is endorsing progressively worsening pain to the dorsal right foot.  She presents to the ED for evaluation of her symptoms.  She is reporting pain with weightbearing.  She has not sought care for this injury in the interim 3 weeks.   Physical Exam   Triage Vital Signs: ED Triage Vitals  Encounter Vitals Group     BP 07/10/23 1821 120/79     Girls Systolic BP Percentile --      Girls Diastolic BP Percentile --      Boys Systolic BP Percentile --      Boys Diastolic BP Percentile --      Pulse Rate 07/10/23 1821 (!) 101     Resp 07/10/23 1821 17     Temp 07/10/23 1821 98.3 F (36.8 C)     Temp Source 07/10/23 1821 Oral     SpO2 07/10/23 1821 98 %     Weight 07/10/23 1834 180 lb (81.6 kg)     Height 07/10/23 1834 5' 2 (1.575 m)     Head Circumference --      Peak Flow --      Pain Score 07/10/23 1834 10     Pain Loc --      Pain Education --      Exclude from Growth Chart --     Most recent vital signs: Vitals:   07/10/23 1821  BP: 120/79  Pulse: (!) 101  Resp: 17  Temp: 98.3 F (36.8 C)  SpO2: 98%    General Awake, no distress. NAD HEENT NCAT. PERRL. EOMI. No rhinorrhea. Mucous membranes are moist. CV:  Good peripheral perfusion. RRR. No CCE RESP:  Normal effort.  MSK:  Right foot and ankle with some moderate soft tissue swelling noted to the medial lateral malleoli.  Active range of motion of the ankle joint in all planes.  No calf or Achilles tenderness is  noted.  Negative anterior/posterior drawer sign.   ED Results / Procedures / Treatments   Labs (all labs ordered are listed, but only abnormal results are displayed) Labs Reviewed - No data to display   EKG   RADIOLOGY  I personally viewed and evaluated these images as part of my medical decision making, as well as reviewing the written report by the radiologist.  ED Provider Interpretation: No acute fracture or dislocation  DG Ankle Complete Right Result Date: 07/10/2023 CLINICAL DATA:  Fall and right ankle pain. EXAM: RIGHT ANKLE - COMPLETE 3+ VIEW COMPARISON:  Right foot radiograph dated 07/10/2023. FINDINGS: Tiny bone fragment along the inferior margin of the lateral malleolus may be chronic. An acute avulsion fracture is less likely but not excluded. Clinical correlation is recommended. No other acute fracture. No dislocation. The bones are well mineralized. The ankle mortise is intact. The soft tissues are unremarkable. IMPRESSION: Probable chronic fragment versus less likely avulsion fracture of the inferior cortex of the lateral malleolus. Electronically Signed   By: Vanetta  Radparvar M.D.   On: 07/10/2023 21:43   DG Foot 2 Views Right Result Date: 07/10/2023 CLINICAL DATA:  Pain after fall. Right foot pain. Fall 22 days ago at work. EXAM: RIGHT FOOT - 2 VIEW COMPARISON:  None Available. FINDINGS: No acute or healing/healed fracture. Normal alignment on these nonweightbearing views. Small os navicular. No erosive change or periostitis. No focal soft tissue abnormalities. IMPRESSION: No fracture or dislocation of the right foot. Electronically Signed   By: Andrea Gasman M.D.   On: 07/10/2023 20:20     PROCEDURES:  Critical Care performed: No  Procedures   MEDICATIONS ORDERED IN ED: Medications  ibuprofen  (ADVIL ) tablet 800 mg (800 mg Oral Given 07/10/23 2132)     IMPRESSION / MDM / ASSESSMENT AND PLAN / ED COURSE  I reviewed the triage vital signs and the nursing  notes.                              Differential diagnosis includes, but is not limited to, sprain, fracture, contusion, dislocation  Patient's presentation is most consistent with acute complicated illness / injury requiring diagnostic workup.  Patient's diagnosis is consistent with ankle sprain. Patient will be discharged home with prescriptions for ibuprofen .  Patient will be placed in a lace up ankle brace for support.  She will be given RICE instructions for pain and inflammation management.  Patient is to follow up with podiatry as suggested, as needed or otherwise directed. Patient is given ED precautions to return to the ED for any worsening or new symptoms.   FINAL CLINICAL IMPRESSION(S) / ED DIAGNOSES   Final diagnoses:  Sprain of right foot, initial encounter  Sprain of right ankle, unspecified ligament, initial encounter     Rx / DC Orders   ED Discharge Orders          Ordered    ibuprofen  (ADVIL ) 800 MG tablet  Every 8 hours PRN        07/10/23 2206             Note:  This document was prepared using Dragon voice recognition software and may include unintentional dictation errors.    Loyd Candida LULLA Aldona, PA-C 07/10/23 2316    Clarine Ozell LABOR, MD 07/10/23 2342

## 2023-07-10 NOTE — ED Triage Notes (Signed)
 Pt comes in via pov with complaints of right foot pain. Pt states that 22 days ago she slipped and fell at work. Over the past few weeks the pain has gotten worse, and yesterday it became unbearable. Pt with swelling to the top of foot.  Pt is unable to bear weight on her foot at this time.

## 2023-07-10 NOTE — Discharge Instructions (Addendum)
 Your exam and x-rays are negative for any acute fracture or dislocation to your ankle or foot.  Your symptoms are consistent with an ankle sprain.  Wear the ankle brace as needed for support.  Rest with the foot elevate and apply ice to reduce swelling.  You should follow-up with podiatry for ongoing evaluation.  Take the prescription anti-inflammatory as directed.  Tu examen y radiografas son negativos para cualquier fractura aguda o dislocacin en tu tobillo o pie. Tus sntomas son consistentes con un esguince de tobillo. Usa  la frula para el tobillo segn sea necesario para soporte. Descansa con el pie elevado y aplica hielo para reducir la hinchazn. Debes hacer un seguimiento con podologa para una evaluacin continua. Toma el antiinflamatorio recetado segn las indicaciones.

## 2023-07-10 NOTE — ED Notes (Signed)
 Brace applied to right ankle  Pa-c jenise in with pt.  Interpreter on a stick used   d/c instru to pt.

## 2023-09-09 ENCOUNTER — Emergency Department
Admission: EM | Admit: 2023-09-09 | Discharge: 2023-09-09 | Disposition: A | Discharge status: Home / Self Care | Payer: Self-pay | Attending: Emergency Medicine | Admitting: Emergency Medicine

## 2023-09-09 ENCOUNTER — Other Ambulatory Visit: Payer: Self-pay

## 2023-09-09 ENCOUNTER — Emergency Department: Payer: Self-pay

## 2023-09-09 DIAGNOSIS — O039 Complete or unspecified spontaneous abortion without complication: Secondary | ICD-10-CM | POA: Insufficient documentation

## 2023-09-09 LAB — BASIC METABOLIC PANEL WITH GFR
Anion gap: 11 (ref 5–15)
BUN: 6 mg/dL (ref 6–20)
CO2: 21 mmol/L — ABNORMAL LOW (ref 22–32)
Calcium: 9.4 mg/dL (ref 8.9–10.3)
Chloride: 106 mmol/L (ref 98–111)
Creatinine, Ser: 0.31 mg/dL — ABNORMAL LOW (ref 0.44–1.00)
GFR, Estimated: >60 mL/min (ref >60)
Glucose, Bld: 85 mg/dL (ref 70–99)
Potassium: 3.1 mmol/L — ABNORMAL LOW (ref 3.5–5.1)
Sodium: 138 mmol/L (ref 135–145)

## 2023-09-09 LAB — CBC
HCT: 43.7 % (ref 36.0–46.0)
Hemoglobin: 14.5 g/dL (ref 12.0–15.0)
MCH: 25.8 pg — ABNORMAL LOW (ref 26.0–34.0)
MCHC: 33.2 g/dL (ref 30.0–36.0)
MCV: 77.6 fL — ABNORMAL LOW (ref 80.0–100.0)
Platelets: 317 K/uL (ref 150–400)
RBC: 5.63 MIL/uL — ABNORMAL HIGH (ref 3.87–5.11)
RDW: 15.2 % (ref 11.5–15.5)
WBC: 9.5 K/uL (ref 4.0–10.5)
nRBC: 0 % (ref 0.0–0.2)

## 2023-09-09 LAB — HCG, QUANTITATIVE, PREGNANCY: hCG, Beta Chain, Quant, S: 44950 m[IU]/mL — ABNORMAL HIGH (ref <5)

## 2023-09-09 LAB — ABO/RH: ABO/RH(D): O POS

## 2023-09-09 LAB — TYPE AND SCREEN
ABO/RH(D): O POS
Antibody Screen: NEGATIVE

## 2023-09-09 LAB — URINALYSIS, ROUTINE W REFLEX MICROSCOPIC
Bacteria, UA: NONE SEEN
Bilirubin Urine: NEGATIVE
Glucose, UA: NEGATIVE mg/dL
Hgb urine dipstick: NEGATIVE
Ketones, ur: 80 mg/dL — AB
Nitrite: NEGATIVE
Protein, ur: NEGATIVE mg/dL
Specific Gravity, Urine: 1.015 (ref 1.005–1.030)
pH: 6 (ref 5.0–8.0)

## 2023-09-09 LAB — POC URINE PREG, ED: Preg Test, Ur: POSITIVE — AB

## 2023-09-09 NOTE — Consult Note (Signed)
 Consult History and Physical   SERVICE: Gynecology   Patient Name: Donna Wolfe Patient MRN:   [id number]  Due to language barrier, an interpreter was present during the history-taking and subsequent discussion (and for part of the physical exam) with this patient.  CC: cramping   HPI: Donna Wolfe is a 23 y.o. G3P1001 who presented for lower abdominal cramping and back pain. Pain began earlier today around 1730. Denies vaginal bleeding or change in vaginal discharge. She had a positive pregnancy test but has not established prenatal care yet. Her LMP was 06/19/2023 and she reports regular, monthly menstrual cycles.    Review of Systems  Constitutional:  Negative for chills and fever.  Respiratory:  Negative for cough and shortness of breath.   Cardiovascular:  Negative for chest pain.  Gastrointestinal:  Positive for abdominal pain. Negative for heartburn and nausea.  Genitourinary:  Negative for dysuria, frequency and urgency.  Musculoskeletal:  Positive for back pain.  Skin:  Negative for itching and rash.  Neurological:  Negative for dizziness and headaches.  Psychiatric/Behavioral:  Negative for depression.      Past Obstetrical History: OB History     Gravida  3   Para  1   Term  1   Preterm  0   AB  0   Living  1      SAB  0   IAB  0   Ectopic  0   Multiple      Live Births  1           Past Gynecologic History: Patient's last menstrual period was 06/19/2023 (approximate).  Menstrual cycles are regular every 28-30 days without intermenstrual spotting  Past Medical History: Past Medical History:  Diagnosis Date   Medical history non-contributory    Preterm labor 12/29/2021    Past Surgical History:   Past Surgical History:  Procedure Laterality Date   denies     NO PAST SURGERIES      Family History:  family history includes Diabetes in her maternal grandmother; Healthy in her brother, brother, father,  maternal grandfather, mother, paternal grandfather, paternal grandmother, sister, sister, and son.  Social History:  Social History   Socioeconomic History   Marital status: Media planner    Spouse name: Theotis Snowman   Number of children: 1   Years of education: 10   Highest education level: 10th grade  Occupational History   Occupation: unemployed  Tobacco Use   Smoking status: Never    Passive exposure: Never   Smokeless tobacco: Never  Vaping Use   Vaping status: Never Used  Substance and Sexual Activity   Alcohol use: Never   Drug use: Never   Sexual activity: Yes    Birth control/protection: None    Comment: undecided  Other Topics Concern   Not on file  Social History Narrative   Lives with her sister, son, and partner/significant other. Partner, Theotis, is supportive and happy with pregnancy, it will be his first child.    Social Drivers of Corporate investment banker Strain: Low Risk  (09/20/2021)   Overall Financial Resource Strain (CARDIA)    Difficulty of Paying Living Expenses: Not hard at all  Food Insecurity: No Food Insecurity (02/06/2022)   Hunger Vital Sign    Worried About Running Out of Food in the Last Year: Never true    Ran Out of Food in the Last Year: Never true  Transportation Needs: No Transportation Needs (02/06/2022)  PRAPARE - Administrator, Civil Service (Medical): No    Lack of Transportation (Non-Medical): No  Physical Activity: Not on file  Stress: Not on file  Social Connections: Not on file  Intimate Partner Violence: Not At Risk (02/06/2022)   Humiliation, Afraid, Rape, and Kick questionnaire    Fear of Current or Ex-Partner: No    Emotionally Abused: No    Physically Abused: No    Sexually Abused: No    Home Medications:  Medications reconciled in EPIC  No current facility-administered medications on file prior to encounter.   Current Outpatient Medications on File Prior to Encounter  Medication Sig Dispense  Refill   ibuprofen  (ADVIL ) 800 MG tablet Take 1 tablet (800 mg total) by mouth every 8 (eight) hours as needed. 30 tablet 0    Allergies:  No Known Allergies  Physical Exam:  Temp:  [98.2 F (36.8 C)-98.3 F (36.8 C)] 98.3 F (36.8 C) (08/31 2125) Pulse Rate:  [90-92] 90 (08/31 2125) Resp:  [18-20] 18 (08/31 2125) BP: (119-128)/(75-80) 119/75 (08/31 2125) SpO2:  [97 %-100 %] 100 % (08/31 2125) Weight:  [81.6 kg] 81.6 kg (08/31 1819)  Physical Exam Constitutional:      Appearance: Normal appearance.  Pulmonary:     Effort: Pulmonary effort is normal.  Abdominal:     General: There is no distension.     Palpations: Abdomen is soft.     Tenderness: There is no abdominal tenderness.  Genitourinary:    Vagina: No bleeding.     Uterus: Not tender.   Musculoskeletal:        General: Normal range of motion.     Cervical back: Normal range of motion.  Skin:    General: Skin is warm and dry.     Capillary Refill: Capillary refill takes less than 2 seconds.  Neurological:     General: No focal deficit present.     Mental Status: She is alert and oriented to person, place, and time.  Psychiatric:        Mood and Affect: Mood normal.     Comments: Tearful. Appropriate for clinical situation.       Labs/Studies:   CBC and Coags:  Lab Results  Component Value Date   WBC 9.5 09/09/2023   NEUTOPHILPCT 55 05/05/2022   EOSPCT 18 05/05/2022   BASOPCT 1 05/05/2022   LYMPHOPCT 19 05/05/2022   HGB 14.5 09/09/2023   HCT 43.7 09/09/2023   MCV 77.6 (L) 09/09/2023   PLT 317 09/09/2023   CMP:  Lab Results  Component Value Date   NA 138 09/09/2023   K 3.1 (L) 09/09/2023   CL 106 09/09/2023   CO2 21 (L) 09/09/2023   BUN 6 09/09/2023   CREATININE 0.31 (L) 09/09/2023   CREATININE 0.53 07/20/2022   CREATININE 0.43 (L) 05/05/2022   PROT 8.1 07/20/2022   BILITOT 0.7 07/20/2022   ALT 43 07/20/2022   AST 51 (H) 07/20/2022   ALKPHOS 116 07/20/2022    Other Imaging: US  OB Comp  Less 14 Wks Result Date: 09/09/2023 CLINICAL DATA:  Abdominal pain during pregnancy. EXAM: OBSTETRIC <14 WK ULTRASOUND TECHNIQUE: Transabdominal ultrasound was performed for evaluation of the gestation as well as the maternal uterus and adnexal regions. COMPARISON:  None Available. FINDINGS: Intrauterine gestational sac: Single Yolk sac:  Not Visualized. Embryo:  Visualized. Cardiac Activity: Not Visualized. Heart Rate: 0 bpm CRL:   30.2 mm   10 w 5 d  US  EDC: 04/01/2024 Subchorionic hemorrhage: Small subchorionic hemorrhage identified measuring 1.3 x 0.9 x 2.2 cm. Maternal uterus/adnexae: The bilateral ovaries are visualized and appear within normal limits. There is no pelvic free fluid. IMPRESSION: 1. Single failed intrauterine pregnancy measuring 10 weeks 5 days by crown-rump length. No cardiac activity. 2. Small subchorionic hemorrhage. Electronically Signed   By: Greig Pique M.D.   On: 09/09/2023 20:02     Assessment / Plan:   Donna Wolfe is a 23 y.o. G3P1001 who presents with missed early pregnancy loss  Missed Early Pregnancy Loss  Condolences were offered to the patient.  I stressed that while emotionally difficult, that this did not occur because of an actions or inactions by the patient.  Somewhere between 10-20% of identified first trimester pregnancies will unfortunately end in miscarriage.  Given this relatively high incidence rate, further diagnostic testing such as chromosome analysis is generally not clinically relevant nor recommended.  Although the chromosomal abnormalities have been implicated at rates as high as 70% in some studies, these are generally random and do not infer and increased risk of recurrence with subsequent pregnancies.    We briefly discussed management options including expectant management, medical management, and surgical management as well as their relative success rates and complications. Expectant management is successful in  achieving spontaneous complete expulsion in 80% of women. Limited data supports that this method may be more effective in women who are symptomatic of early pregnancy.   Medical management can be considered for early pregnancy loss in women without infection, hemorrhage, severe anemia, or bleeding disorders who desire to short the time frame to complete expulsion and want to avoid surgical intervention. Approximately 71% of women demonstrated complete expulsion after one dose of Misoprostol  800 mcg. The success rate increased to 84% when a second dose of Misoprostol  800 mcg was administered if needed (ACOG Practice Bulletin 200 Early Pregnancy Loss). Mifepristone (200 mg orally) 24 hours prior to misoprostol  can increase the success of medical management and should be considered where it is available. This option is available to appropriate candidates up to 84 days of pregnancy.   Dilation and curettage has the highest rate of uterine evacuation, but carries with is operative cost, surgical and anesthetic risk.  While these risk are relatively small they nevertheless include infection, bleeding, uterine perforation, formation of uterine synechia, and in rare cases death.  We discussed repeat ultrasound and or trending HCG levels if the patient wishes to pursue these prior to making her decision.  Clinically I am confident of the diagnosis, but I do not want any doubts in the patient's mind regarding the plan of management she chooses to adopt.  I will allow the patient and her family to discuss management options and she was advised to contact the office to arrange final disposition one she has made her decision or should she have any follow up questions.   At this time, Jemeka desires to repeat the ultrasound and discuss further management options based on the ultrasound results. We will contact her this week to help arrange a follow up ultrasound.   The patient is Rh pos and rhogam is therefore not  indicated.     Thank you for the opportunity to be involved with this patient's care.  ----- [name] Pillow, CNM Midwife [facility], Department of OB/GYN [facility]

## 2023-09-09 NOTE — ED Triage Notes (Signed)
 Pt to ED for pain to lower belly my womb and lower back since 1 hour. Pain is constant. No bleeding. LNMP was around 06/19/23, pending EDD when goes to first OB appt in Sept.  Pink top sent in case ABO ordered.

## 2023-09-09 NOTE — ED Provider Notes (Signed)
 [place] EMERGENCY DEPARTMENT AT [facility] Provider Note   CSN: [id number] Arrival date & time: 09/09/23  [id number]     Patient presents with: Pelvic Pain and Back Pain   Donna Wolfe is a 23 y.o. female.   Patient [redacted] weeks pregnant by LMP here for pelvic and low back pain.  Noting about 1 hour of symptoms feel like they are deep in her uterus.  Denying any vaginal pain odor or bleeding discharge.  Has not yet had an ultrasound or seen OB/GYN.   Pelvic Pain Associated symptoms include abdominal pain.  Back Pain Associated symptoms: abdominal pain and pelvic pain        Prior to Admission medications   Medication Sig Start Date End Date Taking? Authorizing Provider  ibuprofen  (ADVIL ) 800 MG tablet Take 1 tablet (800 mg total) by mouth every 8 (eight) hours as needed. 07/10/23   Menshew, Donna LULLA Kings, PA-C    Allergies: Patient has no known allergies.    Review of Systems  Gastrointestinal:  Positive for abdominal pain.  Genitourinary:  Positive for pelvic pain.  Musculoskeletal:  Positive for back pain.  All other systems reviewed and are negative.   Updated Vital Signs BP 119/75   Pulse 90   Temp 98.3 F (36.8 C) (Oral)   Resp 18   Ht 5' (1.524 m)   Wt 81.6 kg   LMP 06/19/2023 (Approximate)   SpO2 100%   BMI 35.15 kg/m   Physical Exam Vitals reviewed.  Constitutional:      General: She is not in acute distress.    Appearance: Normal appearance.  HENT:     Head: Normocephalic and atraumatic.     Nose: Nose normal.  Cardiovascular:     Pulses: Normal pulses.  Pulmonary:     Effort: Pulmonary effort is normal.  Abdominal:     General: There is no distension.     Palpations: Abdomen is soft.     Tenderness: There is no abdominal tenderness. There is no right CVA tenderness, left CVA tenderness or guarding.  Musculoskeletal:     Cervical back: Normal range of motion.  Neurological:     Mental Status: She is alert and oriented to  person, place, and time. Mental status is at baseline.  Psychiatric:        Mood and Affect: Mood normal.        Behavior: Behavior normal.     (all labs ordered are listed, but only abnormal results are displayed) Labs Reviewed  URINALYSIS, ROUTINE W REFLEX MICROSCOPIC - Abnormal; Notable for the following components:      Result Value   Color, Urine YELLOW (*)    APPearance CLEAR (*)    Ketones, ur 80 (*)    Leukocytes,Ua TRACE (*)    All other components within normal limits  BASIC METABOLIC PANEL WITH GFR - Abnormal; Notable for the following components:   Potassium 3.1 (*)    CO2 21 (*)    Creatinine, Ser 0.31 (*)    All other components within normal limits  CBC - Abnormal; Notable for the following components:   RBC 5.63 (*)    MCV 77.6 (*)    MCH 25.8 (*)    All other components within normal limits  POC URINE PREG, ED - Abnormal; Notable for the following components:   Preg Test, Ur Positive (*)    All other components within normal limits  HCG, QUANTITATIVE, PREGNANCY  TYPE AND SCREEN  ABO/RH  EKG: None  Radiology: US  OB Comp Less 14 Wks Result Date: 09/09/2023 CLINICAL DATA:  Abdominal pain during pregnancy. EXAM: OBSTETRIC <14 WK ULTRASOUND TECHNIQUE: Transabdominal ultrasound was performed for evaluation of the gestation as well as the maternal uterus and adnexal regions. COMPARISON:  None Available. FINDINGS: Intrauterine gestational sac: Single Yolk sac:  Not Visualized. Embryo:  Visualized. Cardiac Activity: Not Visualized. Heart Rate: 0 bpm CRL:   30.2 mm   10 w 5 d                  US  EDC: 04/01/2024 Subchorionic hemorrhage: Small subchorionic hemorrhage identified measuring 1.3 x 0.9 x 2.2 cm. Maternal uterus/adnexae: The bilateral ovaries are visualized and appear within normal limits. There is no pelvic free fluid. IMPRESSION: 1. Single failed intrauterine pregnancy measuring 10 weeks 5 days by crown-rump length. No cardiac activity. 2. Small subchorionic  hemorrhage. Electronically Signed   By: Greig Pique M.D.   On: 09/09/2023 20:02     Procedures   Medications Ordered in the ED - No data to display  Clinical Course as of 09/09/23 2253  [facility] Sep 09, 2023  8072 Discussed with ultrasonographer who noted appeared consistent with fetal demise [SR]    Clinical Course User Index [SR] Kingston Mallick, PA-C                                 Medical Decision Making Patient [redacted] weeks pregnant with no prior OB care here for pelvic and low back pain without any acute abdominal tenderness on exam do not suspect intra-abdominal etiology.  Will check labs and ultrasound.  Ultrasound with evidence of fetal demise conducted pelvic exam with nurse bedside chaperone cervix appears closed.  Discussed with on-call midwife [name] who evaluated patient discussed options the patient elected for outpatient ultrasound.  Amount and/or Complexity of Data Reviewed Labs: ordered. Radiology: ordered.        Final diagnoses:  Fetal demise due to miscarriage    ED Discharge Orders     None          Kingston Mallick, [place] 09/09/23 2253    Jacolyn Pae, MD 09/09/23 2312

## 2023-09-09 NOTE — ED Notes (Signed)
 Pt given discharge instructions. Pt had no questions for this RN at time of discharge.

## 2023-09-11 NOTE — ED Provider Notes (Signed)
 [facility] Emergency Department Provider Note  HPI & Impression   Final diagnoses:  Miscarriage at 8 to [redacted] weeks gestation (HHS-HCC) (Primary)    History of Present Illness Donna Wolfe is a 23 year old female who presents with concerns about her pregnancy after being unable to hear the fetal heartbeat.  She visited [facility] one to two days ago due to back and abdominal pain, where the fetal heartbeat was not audible. Currently, she experiences no bleeding or pain. This is her third pregnancy, with two previous uncomplicated pregnancies. Her last menstrual period was in June, estimating her to be around twelve weeks pregnant. She has not yet had her first prenatal appointment. An OBGYN appointment is scheduled for the ninth of this month.  MDM   Vitals: BP 121/72   Pulse 97   Temp 36.8 C (98.2 F) (Oral)   Resp 20   Wt 76.9 kg (169 lb 8.5 oz)   SpO2 97%   Breastfeeding Unknown    Donna Wolfe is a 23 y.o. female G3P2 presenting for pregnancy evaluation  Afebrile, hemodynamically stable. On exam, patient is overall well-appearing in no acute distress.  Abdomen soft, nondistended, nontender to palpation without rebound or guarding.  Patient states that she is not symptomatic however she was seen at [facility] over the weekend with concern for intrauterine demise on an ultrasound performed there.  Patient tells me that they had a hard time finding a heart rate.  On my point-of-care ultrasound, no fetal heart rate was found.  Will obtain transvaginal ultrasound.  Will continue to monitor and reassess patient.  Please see ED course below for further details.  Diagnostics: Orders Placed This Encounter  Procedures  . ED POCUS  . hCG, Quantitative, Pregnancy  . CBC w/ Differential  . Basic metabolic panel  . ED Extra Tubes  . Type and Screen with Confirmation ABORh  . ABO/RH  . US  OB Transvaginal    ED Course as of 09/11/23 1101  Tue Sep 11, 2023   1044 Transvaginal ultrasound confirms intrauterine demise.  I discussed this in great detail with the patient and her husband.  They have elected for expectant management but do not have any follow-up with OB/GYN.  I have paged OB to discuss follow-up, they state that they will come evaluate the patient and provide follow-up upon discharge.    The case was discussed with the attending physician who is in agreement with the above assessment and plan.  Additional MDM Elements     Discussion with other professionals: See MDM above Independent interpretation: See MDM above          Physical Exam   Vitals:   09/11/23 0708  BP: 121/72  Pulse: 97  Resp: 20  Temp: 36.8 C (98.2 F)  TempSrc: Oral  SpO2: 97%  Weight: 76.9 kg (169 lb 8.5 oz)     Constitutional: Well-appearing, NAD.  HEENT: Normocephalic and atraumatic. Conjunctivae are normal. EOMI.  Mucous membranes are moist.  Neck: Full active ROM Cardiovascular: See heart rate listed above.  Extremities are warm and well-perfused. Respiratory: See respiratory rate listed above.  Speaking easily in full sentences without audible stridor or wheezing.  Equal chest rise without increased work of breathing. Gastrointestinal: Abdomen is soft, non-distended, non-tender.  No rebound or guarding. Musculoskeletal: No long bone deformities.  No lower extremity edema.  Neurologic: No facial asymmetry.  Normal speech and language.  Moving all extremities symmetrically, spontaneously Skin: Skin is warm, dry. Psychiatric:  Mood and affect are normal.   Past History   PAST MEDICAL HISTORY/PAST SURGICAL HISTORY:  Past Medical History[1]  Past Surgical History[2]  MEDICATIONS:  No current facility-administered medications for this encounter.  Current Outpatient Medications:  .  acetaminophen  (TYLENOL  EXTRA STRENGTH) 500 MG tablet, Take 2 tablets (1,000 mg total) by mouth every six (6) hours as needed for pain., Disp: 30 tablet, Rfl:  0 .  ibuprofen  (MOTRIN ) 600 MG tablet, Take 1 tablet (600 mg total) by mouth Three (3) times a day., Disp: 30 tablet, Rfl: 0  ALLERGIES:  Patient has no known allergies.  SOCIAL HISTORY:  Social History   Tobacco Use  . Smoking status: Never    Passive exposure: Never  . Smokeless tobacco: Never  Substance Use Topics  . Alcohol use: Not on file    FAMILY HISTORY: Family History[3]    Radiology   US  OB Transvaginal  Final Result  -Intrauterine pregnancy with findings diagnostic of early pregnancy loss. This includes nonvisualization of fetal cardiac activity. No yolk sac identified.  - Clinical management and follow up per obstetrical team, which can include ectopic precautions, trending of hCG, and follow up ultrasound as indicated.    Please note that this examination was not performed for purposes of assessing fetal anatomy and/or the placenta and is not diagnostic for these purposes. This is not a surrogate for and should not replace dedicated fetal anatomy scan.        ED POCUS         Laboratory Data   Lab Results  Component Value Date   WBC 8.2 09/11/2023   HGB 14.2 09/11/2023   HCT 40.5 09/11/2023   PLT 275 09/11/2023    Lab Results  Component Value Date   NA 140 09/11/2023   K 3.6 09/11/2023   CL 103 09/11/2023   CO2 22.0 09/11/2023   BUN 5 (L) 09/11/2023   CREATININE 0.47 (L) 09/11/2023   GLU 70 09/11/2023   CALCIUM  9.5 09/11/2023    Lab Results  Component Value Date   BILITOT 0.5 04/23/2023   PROT 7.3 04/23/2023   ALBUMIN 3.8 04/23/2023   ALT 19 04/23/2023   AST 27 04/23/2023   ALKPHOS 123 (H) 04/23/2023    Lab Results  Component Value Date   INR 0.96 09/11/2019   APTT 28.1 09/11/2019    Bernardino Able, DO [employer]   Emergency Medicine, PGY3  Portions of this record have been created using [employer]. Dictation errors have been sought, but may not have been identified and corrected.         [1] No past medical history on file. [2] No past surgical history on file. [3] History reviewed. No pertinent family history.  Able Bernardino PARAS, DO Resident 09/11/23 [phone]

## 2023-09-11 NOTE — ED Triage Notes (Signed)
 Pt [redacted] weeks pregnant, requesting check for fetus. Pt denies medical complaints.

## 2023-09-13 ENCOUNTER — Emergency Department
Admission: EM | Admit: 2023-09-13 | Discharge: 2023-09-13 | Disposition: A | Discharge status: Home / Self Care | Payer: Self-pay | Attending: Emergency Medicine | Admitting: Emergency Medicine

## 2023-09-13 ENCOUNTER — Other Ambulatory Visit: Payer: Self-pay

## 2023-09-13 ENCOUNTER — Emergency Department: Payer: Self-pay

## 2023-09-13 DIAGNOSIS — Z3A10 10 weeks gestation of pregnancy: Secondary | ICD-10-CM | POA: Insufficient documentation

## 2023-09-13 DIAGNOSIS — O021 Missed abortion: Secondary | ICD-10-CM | POA: Insufficient documentation

## 2023-09-13 LAB — COMPREHENSIVE METABOLIC PANEL WITH GFR
ALT: 16 U/L (ref 0–44)
AST: 28 U/L (ref 15–41)
Albumin: 3.7 g/dL (ref 3.5–5.0)
Alkaline Phosphatase: 67 U/L (ref 38–126)
Anion gap: 12 (ref 5–15)
BUN: 6 mg/dL (ref 6–20)
CO2: 21 mmol/L — ABNORMAL LOW (ref 22–32)
Calcium: 9.1 mg/dL (ref 8.9–10.3)
Chloride: 103 mmol/L (ref 98–111)
Creatinine, Ser: 0.47 mg/dL (ref 0.44–1.00)
GFR, Estimated: >60 mL/min (ref >60)
Glucose, Bld: 83 mg/dL (ref 70–99)
Potassium: 3.4 mmol/L — ABNORMAL LOW (ref 3.5–5.1)
Sodium: 136 mmol/L (ref 135–145)
Total Bilirubin: 1 mg/dL (ref 0.0–1.2)
Total Protein: 7.4 g/dL (ref 6.5–8.1)

## 2023-09-13 LAB — CBC
HCT: 43.4 % (ref 36.0–46.0)
Hemoglobin: 14.5 g/dL (ref 12.0–15.0)
MCH: 25.7 pg — ABNORMAL LOW (ref 26.0–34.0)
MCHC: 33.4 g/dL (ref 30.0–36.0)
MCV: 76.8 fL — ABNORMAL LOW (ref 80.0–100.0)
Platelets: 319 K/uL (ref 150–400)
RBC: 5.65 MIL/uL — ABNORMAL HIGH (ref 3.87–5.11)
RDW: 15.3 % (ref 11.5–15.5)
WBC: 8.1 K/uL (ref 4.0–10.5)
nRBC: 0 % (ref 0.0–0.2)

## 2023-09-13 LAB — HCG, QUANTITATIVE, PREGNANCY: hCG, Beta Chain, Quant, S: 22438 m[IU]/mL — ABNORMAL HIGH (ref <5)

## 2023-09-13 LAB — LIPASE, BLOOD: Lipase: 26 U/L (ref 11–51)

## 2023-09-13 NOTE — ED Triage Notes (Addendum)
 Pt comes with c/o abdominal pain. Pt states on Sunday she had pain and came to ED. Pt had experienced miscarriage per staff. Pt states nothing has come out. Pt was told her baby had passed.  Pt was [redacted] weeks pregnant/ Pt states no vaginal bleeding.

## 2023-09-13 NOTE — ED Provider Notes (Signed)
 [facility] Provider Note    Event Date/Time   First MD Initiated Contact with Patient 09/13/23 1017     (approximate)   History   Abdominal Pain   HPI  Donna Wolfe is a 23 y.o. female currently pregnant, but advised of fetal demise, no other significant past medical history and as listed in EMR presents to the emergency department for treatment and evaluation of intermittent low back pain and low abdominal cramping without vaginal bleeding since yesterday evening. She has chosen expectant management over medication or surgical options. She denies nausea, vomiting, fever.   Physical Exam    Vitals:   09/13/23 0950  BP: 117/74  Pulse: 95  Resp: 18  Temp: 98 F (36.7 C)  SpO2: 99%    General: Awake, no distress.  CV:  Good peripheral perfusion.  Resp:  Normal effort.  Abd:  No distention. Soft. No reproducible tenderness. Other:     ED Results / Procedures / Treatments   Labs (all labs ordered are listed, but only abnormal results are displayed)  Labs Reviewed  COMPREHENSIVE METABOLIC PANEL WITH GFR - Abnormal; Notable for the following components:      Result Value   Potassium 3.4 (*)    CO2 21 (*)    All other components within normal limits  CBC - Abnormal; Notable for the following components:   RBC 5.65 (*)    MCV 76.8 (*)    MCH 25.7 (*)    All other components within normal limits  HCG, QUANTITATIVE, PREGNANCY - Abnormal; Notable for the following components:   hCG, Beta Chain, Quant, S 22,438 (*)    All other components within normal limits  LIPASE, BLOOD  URINALYSIS, ROUTINE W REFLEX MICROSCOPIC     EKG  Not indicated.   RADIOLOGY  Image and radiology report reviewed and interpreted by me. Radiology report consistent with the same.  Ultrasound shows no cardiac activity.  Measurement consistent with 10 weeks and 4 days.  Multiple small subchorionic hemorrhages may be present.  Findings meet definitive  criteria for failed pregnancy  PROCEDURES:  Critical Care performed: No  Procedures   MEDICATIONS ORDERED IN ED:  Medications - No data to display   IMPRESSION / MDM / ASSESSMENT AND PLAN / ED COURSE   I have reviewed the triage note and vital signs. Vital signs stable.   Differential diagnosis includes, but is not limited to, miscarriage, fetal demise, pregnancy.  Patient's presentation is most consistent with acute presentation with potential threat to life or bodily function.  23 year old female G3P2 presents with known fetal demise, but no vaginal bleeding and until last night, no cramping.   Previous ER visit records and consult note from 09/09/23 by [name] Pillow, CNM reviewed.   Patient prefers one last ultrasound and beta Hcg to ensure there is no evidence that the pregnancy will progress before making a decision about what to do.  Ultrasound confirms fetal demise. Beta Hcg has lessened by half. Results reviewed with the patient and spouse who now choose medication assistance.  Consulted with [name] Pillow, CNM who will have the schedulers get her set up for an appointment for mifepristone 24 hours prior to misoprostol .   In house interpreters utilized for communication with patient regarding results, assessment, plan, and final discharge instructions.      FINAL CLINICAL IMPRESSION(S) / ED DIAGNOSES   Final diagnoses:  Missed abortion with fetal demise before 20 completed weeks of gestation  Rx / DC Orders   ED Discharge Orders     None        Note:  This document was prepared using Dragon voice recognition software and may include unintentional dictation errors.   Herlinda Kirk NOVAK, FNP 09/13/23 1418    Waymond Lorelle Cummins, MD 09/13/23 [phone]

## 2023-09-18 ENCOUNTER — Other Ambulatory Visit: Payer: Self-pay

## 2023-09-18 DIAGNOSIS — O039 Complete or unspecified spontaneous abortion without complication: Secondary | ICD-10-CM | POA: Insufficient documentation

## 2023-09-18 DIAGNOSIS — N9489 Other specified conditions associated with female genital organs and menstrual cycle: Secondary | ICD-10-CM | POA: Insufficient documentation

## 2023-09-18 DIAGNOSIS — R000 Tachycardia, unspecified: Secondary | ICD-10-CM | POA: Insufficient documentation

## 2023-09-18 NOTE — ED Triage Notes (Signed)
 Pt reports she was aprox 3 months pregnant and had an ultrasound that confirmed fetal demise, pt repots she took some pills she was prescribed today to expel the contents of her uterus. Pt reports heavy vaginal bleeding and lower abd cramping.

## 2023-09-19 ENCOUNTER — Emergency Department
Admission: EM | Admit: 2023-09-19 | Discharge: 2023-09-19 | Disposition: A | Discharge status: Home / Self Care | Payer: Self-pay | Attending: Emergency Medicine | Admitting: Emergency Medicine

## 2023-09-19 DIAGNOSIS — N9489 Other specified conditions associated with female genital organs and menstrual cycle: Secondary | ICD-10-CM

## 2023-09-19 DIAGNOSIS — O039 Complete or unspecified spontaneous abortion without complication: Secondary | ICD-10-CM

## 2023-09-19 LAB — CBC WITH DIFFERENTIAL/PLATELET
Abs Immature Granulocytes: 0.06 K/uL (ref 0.00–0.07)
Basophils Absolute: 0.1 K/uL (ref 0.0–0.1)
Basophils Relative: 1 %
Eosinophils Absolute: 0.1 K/uL (ref 0.0–0.5)
Eosinophils Relative: 1 %
HCT: 41.1 % (ref 36.0–46.0)
Hemoglobin: 13 g/dL (ref 12.0–15.0)
Immature Granulocytes: 1 %
Lymphocytes Relative: 30 %
Lymphs Abs: 3.7 K/uL (ref 0.7–4.0)
MCH: 25.6 pg — ABNORMAL LOW (ref 26.0–34.0)
MCHC: 31.6 g/dL (ref 30.0–36.0)
MCV: 81.1 fL (ref 80.0–100.0)
Monocytes Absolute: 0.9 K/uL (ref 0.1–1.0)
Monocytes Relative: 7 %
Neutro Abs: 7.7 K/uL (ref 1.7–7.7)
Neutrophils Relative %: 60 %
Platelets: 277 K/uL (ref 150–400)
RBC: 5.07 MIL/uL (ref 3.87–5.11)
RDW: 15.5 % (ref 11.5–15.5)
WBC: 12.6 K/uL — ABNORMAL HIGH (ref 4.0–10.5)
nRBC: 0 % (ref 0.0–0.2)

## 2023-09-19 LAB — BASIC METABOLIC PANEL WITH GFR
Anion gap: 7 (ref 5–15)
BUN: 8 mg/dL (ref 6–20)
CO2: 19 mmol/L — ABNORMAL LOW (ref 22–32)
Calcium: 8.7 mg/dL — ABNORMAL LOW (ref 8.9–10.3)
Chloride: 108 mmol/L (ref 98–111)
Creatinine, Ser: 0.41 mg/dL — ABNORMAL LOW (ref 0.44–1.00)
GFR, Estimated: >60 mL/min (ref >60)
Glucose, Bld: 118 mg/dL — ABNORMAL HIGH (ref 70–99)
Potassium: 3.4 mmol/L — ABNORMAL LOW (ref 3.5–5.1)
Sodium: 134 mmol/L — ABNORMAL LOW (ref 135–145)

## 2023-09-19 LAB — HCG, QUANTITATIVE, PREGNANCY: hCG, Beta Chain, Quant, S: 4774 m[IU]/mL — ABNORMAL HIGH (ref <5)

## 2023-09-19 MED ORDER — OXYCODONE HCL 5 MG PO TABS: 5.0000 mg | ORAL_TABLET | Freq: Three times a day (TID) | ORAL | 0 refills | Status: AC | PRN

## 2023-09-19 MED ORDER — ACETAMINOPHEN 500 MG PO TABS
1000.0000 mg | ORAL_TABLET | Freq: Once | ORAL | Status: AC
  Administered 2023-09-19: 1000 mg via ORAL
  Filled 2023-09-19: qty 2

## 2023-09-19 MED ORDER — OXYCODONE HCL 5 MG PO TABS
5.0000 mg | ORAL_TABLET | Freq: Once | ORAL | Status: AC
  Administered 2023-09-19: 5 mg via ORAL
  Filled 2023-09-19: qty 1

## 2023-09-19 NOTE — ED Provider Notes (Addendum)
 [facility] Provider Note    Event Date/Time   First MD Initiated Contact with Patient 09/19/23 0302     (approximate)   History   Vaginal Bleeding   HPI  Donna Wolfe is a 23 y.o. female   Past medical history of no significant past medical history presents Emergency Department with lower abdominal cramping pain and vaginal bleeding, passing of fetal products, after being administered mifepristone in clinic on 09/17/2023 and then a single at home dose of misoprostol  on the afternoon of 09/18/2023.  Since taking these medications she has had severe uterine cramping and passage of fetal tissues and blood.  She states that she had vaginal bleeding earlier today which has now subsided.  She has severe cramping pain in the midline lower abdomen.  Information gathered by Spanish interpreter  Independent Historian contributed to assessment above: Her mother is at bedside to corroborate information above  External Medical Documents Reviewed: Obstetrics note from 09/17/2023 for medical management of miscarriage      Physical Exam   Triage Vital Signs: ED Triage Vitals  Encounter Vitals Group     BP 09/18/23 2256 112/75     Girls Systolic BP Percentile --      Girls Diastolic BP Percentile --      Boys Systolic BP Percentile --      Boys Diastolic BP Percentile --      Pulse Rate 09/18/23 2256 (!) 106     Resp 09/18/23 2256 18     Temp 09/18/23 2256 97.7 F (36.5 C)     Temp Source 09/18/23 2256 Oral     SpO2 09/18/23 2256 97 %     Weight 09/18/23 2255 180 lb 1.9 oz (81.7 kg)     Height 09/18/23 2255 5' (1.524 m)     Head Circumference --      Peak Flow --      Pain Score 09/18/23 2255 10     Pain Loc --      Pain Education --      Exclude from Growth Chart --     Most recent vital signs: Vitals:   09/18/23 2256  BP: 112/75  Pulse: (!) 106  Resp: 18  Temp: 97.7 F (36.5 C)  SpO2: 97%    General: Awake, no distress.  CV:  Good  peripheral perfusion.  Resp:  Normal effort.  Abd:  No distention. Other:  Soft abdomen, mild lower central abdominal tenderness without rigidity guarding, hemodynamic significant for mild tachycardia otherwise normal, normotensive.   ED Results / Procedures / Treatments   Labs (all labs ordered are listed, but only abnormal results are displayed) Labs Reviewed  BASIC METABOLIC PANEL WITH GFR - Abnormal; Notable for the following components:      Result Value   Sodium 134 (*)    Potassium 3.4 (*)    CO2 19 (*)    Glucose, Bld 118 (*)    Creatinine, Ser 0.41 (*)    Calcium  8.7 (*)    All other components within normal limits  CBC WITH DIFFERENTIAL/PLATELET - Abnormal; Notable for the following components:   WBC 12.6 (*)    MCH 25.6 (*)    All other components within normal limits  HCG, QUANTITATIVE, PREGNANCY - Abnormal; Notable for the following components:   hCG, Beta Chain, Quant, S 4,774 (*)    All other components within normal limits     I ordered and reviewed the above labs they are notable  for H&H within normal limit  PROCEDURES:  Critical Care performed: No  Procedures   MEDICATIONS ORDERED IN ED: Medications  oxyCODONE  (Oxy IR/ROXICODONE ) immediate release tablet 5 mg (has no administration in time range)  acetaminophen  (TYLENOL ) tablet 1,000 mg (has no administration in time range)     IMPRESSION / MDM / ASSESSMENT AND PLAN / ED COURSE  I reviewed the triage vital signs and the nursing notes.                                Patient's presentation is most consistent with acute presentation with potential threat to life or bodily function.  Differential diagnosis includes, but is not limited to, adverse effect of medication, miscarriage, considered but less likely intra-abdominal infection or obstruction or retained products/infection   The patient is on the cardiac monitor to evaluate for evidence of arrhythmia and/or significant heart rate  changes.  MDM:    Time course of symptoms and constellation of symptoms most consistent with the effects of her abortion medications.  Fortunately H&H within normal limits, vital signs unremarkable, and time course and symptomatology not matching with other acute abdominal pathologies or retained products.  Rh+, no rhogam. I will give oxycodone , prescription for the same, and I have her continue her medication management as advised by her obstetric doctor and follow-up with obstetrics for an appointment.      FINAL CLINICAL IMPRESSION(S) / ED DIAGNOSES   Final diagnoses:  Miscarriage  Uterine cramping     Rx / DC Orders   ED Discharge Orders          Ordered    oxyCODONE  (ROXICODONE ) 5 MG immediate release tablet  Every 8 hours PRN        09/19/23 0334             Note:  This document was prepared using Dragon voice recognition software and may include unintentional dictation errors.    [name] Mylar, MD 09/19/23 9659    [name] Mylar, MD 09/19/23 [phone]

## 2023-09-19 NOTE — ED Notes (Signed)
Bleeding controlled at this time.

## 2023-09-19 NOTE — Discharge Instructions (Addendum)
 Contine tomando sus medicamentos segn lo prescrito por su obstetra.  Para el dolor, puede tomar 650 mg de acetaminofn cada 6 horas. Para el dolor intenso, tome oxicodona 5 mg segn las indicaciones.  Llame a su obstetra por la maana para programar una cita de seguimiento.  Gracias por elegirnos para su atencin mdica hoy!  Consulte a su mdico de cabecera esta semana para una cita de seguimiento.  Si presenta algn sntoma nuevo, que empeora o inesperado, llame a su mdico de inmediato o regrese a urgencias para una reevaluacin.  Fue un placer atenderle hoy.  Dr. Ginnie RAMAN. Donna Wolfe  --  Continue taking your medications as prescribed by your obstetrics doctor.  For pain you may take 650 mg of acetaminophen  every 6 hours.  Take oxycodone  5 mg as directed for severe pain.  Call your obstetrics doctor in the morning to schedule follow-up appointment.  Thank you for choosing us  for your health care today!  Please see your primary doctor this week for a follow up appointment.   If you have any new, worsening, or unexpected symptoms call your doctor right away or come back to the emergency department for reevaluation.  It was my pleasure to care for you today.   Ginnie EDISON [name], MD
